# Patient Record
Sex: Male | Born: 1960
Health system: Southern US, Community
[De-identification: ages and names within clinical notes are randomized; demographics above are authoritative.]

## PROBLEM LIST (undated history)

## (undated) DIAGNOSIS — I2510 Atherosclerotic heart disease of native coronary artery without angina pectoris: Secondary | ICD-10-CM

## (undated) DIAGNOSIS — C4490 Unspecified malignant neoplasm of skin, unspecified: Secondary | ICD-10-CM

## (undated) DIAGNOSIS — D369 Benign neoplasm, unspecified site: Secondary | ICD-10-CM

## (undated) DIAGNOSIS — E785 Hyperlipidemia, unspecified: Secondary | ICD-10-CM

## (undated) DIAGNOSIS — I10 Essential (primary) hypertension: Secondary | ICD-10-CM

## (undated) DIAGNOSIS — K219 Gastro-esophageal reflux disease without esophagitis: Secondary | ICD-10-CM

## (undated) DIAGNOSIS — K760 Fatty (change of) liver, not elsewhere classified: Secondary | ICD-10-CM

## (undated) DIAGNOSIS — G4730 Sleep apnea, unspecified: Secondary | ICD-10-CM

## (undated) DIAGNOSIS — K589 Irritable bowel syndrome without diarrhea: Secondary | ICD-10-CM

## (undated) DIAGNOSIS — G4733 Obstructive sleep apnea (adult) (pediatric): Secondary | ICD-10-CM

## (undated) HISTORY — DX: Hyperlipidemia, unspecified: E78.5

## (undated) HISTORY — DX: Benign neoplasm, unspecified site: D36.9

## (undated) HISTORY — DX: Sleep apnea, unspecified: G47.30

## (undated) HISTORY — DX: Obstructive sleep apnea (adult) (pediatric): G47.33

## (undated) HISTORY — DX: Atherosclerotic heart disease of native coronary artery without angina pectoris: I25.10

## (undated) HISTORY — DX: Unspecified malignant neoplasm of skin, unspecified: C44.90

## (undated) HISTORY — DX: Irritable bowel syndrome, unspecified: K58.9

## (undated) HISTORY — DX: Fatty (change of) liver, not elsewhere classified: K76.0

## (undated) HISTORY — DX: Gastro-esophageal reflux disease without esophagitis: K21.9

## (undated) HISTORY — DX: Essential (primary) hypertension: I10

---

## 2005-10-25 ENCOUNTER — Ambulatory Visit (HOSPITAL_COMMUNITY): Admission: RE | Admit: 2005-10-25 | Discharge: 2005-10-25 | Payer: Self-pay | Admitting: Gastroenterology

## 2005-11-18 ENCOUNTER — Encounter: Admission: RE | Admit: 2005-11-18 | Discharge: 2005-11-18 | Payer: Self-pay | Admitting: Gastroenterology

## 2006-01-06 ENCOUNTER — Ambulatory Visit (HOSPITAL_COMMUNITY): Admission: RE | Admit: 2006-01-06 | Discharge: 2006-01-06 | Payer: Self-pay | Admitting: Gastroenterology

## 2006-08-11 ENCOUNTER — Ambulatory Visit: Payer: Self-pay | Admitting: Family Medicine

## 2006-11-08 HISTORY — PX: OTHER SURGICAL HISTORY: SHX169

## 2006-11-11 ENCOUNTER — Ambulatory Visit: Payer: Self-pay | Admitting: Family Medicine

## 2006-11-11 LAB — CONVERTED CEMR LAB
BUN: 13 mg/dL (ref 6–23)
CO2: 29 meq/L (ref 19–32)
Creatinine, Ser: 1.2 mg/dL (ref 0.4–1.5)
Glomerular Filtration Rate, Af Am: 83 mL/min/{1.73_m2}
Potassium: 4.6 meq/L (ref 3.5–5.1)
Sodium: 138 meq/L (ref 135–145)

## 2006-12-13 ENCOUNTER — Ambulatory Visit: Payer: Self-pay | Admitting: Family Medicine

## 2006-12-13 LAB — CONVERTED CEMR LAB
CO2: 29 meq/L (ref 19–32)
Calcium: 9.3 mg/dL (ref 8.4–10.5)
GFR calc Af Amer: 83 mL/min
GFR calc non Af Amer: 69 mL/min
Glucose, Bld: 97 mg/dL (ref 70–99)
Potassium: 4.3 meq/L (ref 3.5–5.1)

## 2007-02-13 ENCOUNTER — Ambulatory Visit: Payer: Self-pay | Admitting: Family Medicine

## 2007-02-13 LAB — CONVERTED CEMR LAB
BUN: 11 mg/dL (ref 6–23)
CO2: 30 meq/L (ref 19–32)
Calcium: 10 mg/dL (ref 8.4–10.5)
Chloride: 100 meq/L (ref 96–112)
Creatinine, Ser: 1.1 mg/dL (ref 0.4–1.5)
GFR calc Af Amer: 92 mL/min
Glucose, Bld: 97 mg/dL (ref 70–99)

## 2007-05-17 ENCOUNTER — Ambulatory Visit: Payer: Self-pay | Admitting: Family Medicine

## 2007-05-17 DIAGNOSIS — I10 Essential (primary) hypertension: Secondary | ICD-10-CM | POA: Insufficient documentation

## 2007-05-17 DIAGNOSIS — K589 Irritable bowel syndrome without diarrhea: Secondary | ICD-10-CM | POA: Insufficient documentation

## 2007-05-17 DIAGNOSIS — K219 Gastro-esophageal reflux disease without esophagitis: Secondary | ICD-10-CM | POA: Insufficient documentation

## 2007-05-17 DIAGNOSIS — K449 Diaphragmatic hernia without obstruction or gangrene: Secondary | ICD-10-CM | POA: Insufficient documentation

## 2007-05-17 DIAGNOSIS — K7689 Other specified diseases of liver: Secondary | ICD-10-CM | POA: Insufficient documentation

## 2007-05-17 LAB — CONVERTED CEMR LAB
ALT: 39 units/L (ref 0–53)
AST: 24 units/L (ref 0–37)
Alkaline Phosphatase: 58 units/L (ref 39–117)
BUN: 13 mg/dL (ref 6–23)
Basophils Relative: 0.8 % (ref 0.0–1.0)
CO2: 31 meq/L (ref 19–32)
Calcium: 9.3 mg/dL (ref 8.4–10.5)
Chloride: 107 meq/L (ref 96–112)
Cholesterol: 229 mg/dL (ref 0–200)
GFR calc Af Amer: 83 mL/min
GFR calc non Af Amer: 69 mL/min
HDL: 32.3 mg/dL — ABNORMAL LOW (ref >39.0)
Lymphocytes Relative: 26.4 % (ref 12.0–46.0)
Monocytes Relative: 10.6 % (ref 3.0–11.0)
Neutro Abs: 3.7 10*3/uL (ref 1.4–7.7)
Platelets: 238 10*3/uL (ref 150–400)
RBC: 4.42 M/uL (ref 4.22–5.81)
Total CHOL/HDL Ratio: 7.1
Total Protein: 6.7 g/dL (ref 6.0–8.3)
Triglycerides: 254 mg/dL (ref 0–149)
VLDL: 51 mg/dL — ABNORMAL HIGH (ref 0–40)
WBC: 6.3 10*3/uL (ref 4.5–10.5)

## 2007-05-18 ENCOUNTER — Telehealth (INDEPENDENT_AMBULATORY_CARE_PROVIDER_SITE_OTHER): Payer: Self-pay | Admitting: *Deleted

## 2007-06-27 ENCOUNTER — Ambulatory Visit: Payer: Self-pay | Admitting: Family Medicine

## 2007-06-27 LAB — CONVERTED CEMR LAB
CO2: 32 meq/L (ref 19–32)
Calcium: 8.8 mg/dL (ref 8.4–10.5)
Chloride: 106 meq/L (ref 96–112)
Glucose, Bld: 109 mg/dL — ABNORMAL HIGH (ref 70–99)

## 2007-06-28 ENCOUNTER — Telehealth (INDEPENDENT_AMBULATORY_CARE_PROVIDER_SITE_OTHER): Payer: Self-pay | Admitting: *Deleted

## 2007-07-03 ENCOUNTER — Ambulatory Visit (HOSPITAL_BASED_OUTPATIENT_CLINIC_OR_DEPARTMENT_OTHER): Admission: RE | Admit: 2007-07-03 | Discharge: 2007-07-03 | Payer: Self-pay | Admitting: Surgery

## 2007-07-03 ENCOUNTER — Encounter (INDEPENDENT_AMBULATORY_CARE_PROVIDER_SITE_OTHER): Payer: Self-pay | Admitting: Family Medicine

## 2007-07-03 ENCOUNTER — Encounter (INDEPENDENT_AMBULATORY_CARE_PROVIDER_SITE_OTHER): Payer: Self-pay | Admitting: Surgery

## 2007-07-11 ENCOUNTER — Encounter (INDEPENDENT_AMBULATORY_CARE_PROVIDER_SITE_OTHER): Payer: Self-pay | Admitting: Family Medicine

## 2007-07-20 DIAGNOSIS — C801 Malignant (primary) neoplasm, unspecified: Secondary | ICD-10-CM | POA: Insufficient documentation

## 2007-08-30 ENCOUNTER — Encounter (INDEPENDENT_AMBULATORY_CARE_PROVIDER_SITE_OTHER): Payer: Self-pay | Admitting: Family Medicine

## 2007-09-27 ENCOUNTER — Ambulatory Visit: Payer: Self-pay | Admitting: Family Medicine

## 2007-09-27 DIAGNOSIS — E782 Mixed hyperlipidemia: Secondary | ICD-10-CM | POA: Insufficient documentation

## 2007-10-02 ENCOUNTER — Telehealth (INDEPENDENT_AMBULATORY_CARE_PROVIDER_SITE_OTHER): Payer: Self-pay | Admitting: *Deleted

## 2007-10-02 LAB — CONVERTED CEMR LAB
BUN: 11 mg/dL (ref 6–23)
Calcium: 9.8 mg/dL (ref 8.4–10.5)
Direct LDL: 148.4 mg/dL
GFR calc Af Amer: 83 mL/min
GFR calc non Af Amer: 69 mL/min
Total CHOL/HDL Ratio: 6.1
Triglycerides: 224 mg/dL (ref 0–149)

## 2007-12-29 ENCOUNTER — Ambulatory Visit: Payer: Self-pay | Admitting: Family Medicine

## 2008-01-02 LAB — CONVERTED CEMR LAB
AST: 45 units/L — ABNORMAL HIGH (ref 0–37)
CO2: 31 meq/L (ref 19–32)
Cholesterol: 205 mg/dL (ref 0–200)
Creatinine, Ser: 1.3 mg/dL (ref 0.4–1.5)
HDL: 29.2 mg/dL — ABNORMAL LOW (ref >39.0)
Potassium: 4.6 meq/L (ref 3.5–5.1)
Sodium: 142 meq/L (ref 135–145)

## 2008-01-03 ENCOUNTER — Telehealth (INDEPENDENT_AMBULATORY_CARE_PROVIDER_SITE_OTHER): Payer: Self-pay | Admitting: *Deleted

## 2008-01-15 ENCOUNTER — Telehealth (INDEPENDENT_AMBULATORY_CARE_PROVIDER_SITE_OTHER): Payer: Self-pay | Admitting: *Deleted

## 2008-01-18 ENCOUNTER — Ambulatory Visit: Payer: Self-pay | Admitting: Cardiology

## 2008-01-18 ENCOUNTER — Encounter (INDEPENDENT_AMBULATORY_CARE_PROVIDER_SITE_OTHER): Payer: Self-pay | Admitting: Family Medicine

## 2008-02-08 ENCOUNTER — Ambulatory Visit: Payer: Self-pay | Admitting: Internal Medicine

## 2008-02-13 LAB — CONVERTED CEMR LAB
AST: 31 units/L (ref 0–37)
Albumin: 3.6 g/dL (ref 3.5–5.2)
HDL: 25.8 mg/dL — ABNORMAL LOW (ref >39.0)
Total CHOL/HDL Ratio: 4.6
Triglycerides: 124 mg/dL (ref 0–149)

## 2008-02-15 ENCOUNTER — Ambulatory Visit: Payer: Self-pay | Admitting: Cardiovascular Disease

## 2008-04-15 ENCOUNTER — Ambulatory Visit: Payer: Self-pay | Admitting: Internal Medicine

## 2008-05-14 ENCOUNTER — Ambulatory Visit: Payer: Self-pay | Admitting: Internal Medicine

## 2008-05-16 ENCOUNTER — Telehealth (INDEPENDENT_AMBULATORY_CARE_PROVIDER_SITE_OTHER): Payer: Self-pay | Admitting: *Deleted

## 2008-05-16 ENCOUNTER — Encounter (INDEPENDENT_AMBULATORY_CARE_PROVIDER_SITE_OTHER): Payer: Self-pay | Admitting: *Deleted

## 2008-05-16 LAB — CONVERTED CEMR LAB
CO2: 29 meq/L (ref 19–32)
Calcium: 9 mg/dL (ref 8.4–10.5)
GFR calc Af Amer: 76 mL/min
Sodium: 136 meq/L (ref 135–145)

## 2008-06-17 ENCOUNTER — Ambulatory Visit: Payer: Self-pay | Admitting: Internal Medicine

## 2008-06-20 ENCOUNTER — Encounter (INDEPENDENT_AMBULATORY_CARE_PROVIDER_SITE_OTHER): Payer: Self-pay | Admitting: *Deleted

## 2008-06-20 ENCOUNTER — Ambulatory Visit: Payer: Self-pay | Admitting: Cardiology

## 2008-06-20 LAB — CONVERTED CEMR LAB
ALT: 54 units/L — ABNORMAL HIGH (ref 0–53)
AST: 29 units/L (ref 0–37)
Albumin: 4.1 g/dL (ref 3.5–5.2)
Alkaline Phosphatase: 69 units/L (ref 39–117)
Cholesterol: 140 mg/dL (ref 0–200)
LDL Cholesterol: 84 mg/dL (ref 0–99)
Total Protein: 6.9 g/dL (ref 6.0–8.3)
Triglycerides: 87 mg/dL (ref 0–149)

## 2008-08-05 ENCOUNTER — Encounter: Payer: Self-pay | Admitting: Internal Medicine

## 2008-08-07 ENCOUNTER — Encounter: Admission: RE | Admit: 2008-08-07 | Discharge: 2008-08-07 | Payer: Self-pay | Admitting: Gastroenterology

## 2008-08-08 ENCOUNTER — Encounter: Payer: Self-pay | Admitting: Internal Medicine

## 2008-08-16 ENCOUNTER — Encounter: Payer: Self-pay | Admitting: Internal Medicine

## 2008-10-21 ENCOUNTER — Ambulatory Visit: Payer: Self-pay | Admitting: Internal Medicine

## 2008-10-21 DIAGNOSIS — R1011 Right upper quadrant pain: Secondary | ICD-10-CM | POA: Insufficient documentation

## 2008-10-22 ENCOUNTER — Ambulatory Visit: Payer: Self-pay | Admitting: Internal Medicine

## 2008-11-15 ENCOUNTER — Telehealth (INDEPENDENT_AMBULATORY_CARE_PROVIDER_SITE_OTHER): Payer: Self-pay | Admitting: *Deleted

## 2008-12-25 ENCOUNTER — Ambulatory Visit: Payer: Self-pay | Admitting: Internal Medicine

## 2008-12-26 ENCOUNTER — Ambulatory Visit: Payer: Self-pay | Admitting: Internal Medicine

## 2009-01-01 ENCOUNTER — Telehealth (INDEPENDENT_AMBULATORY_CARE_PROVIDER_SITE_OTHER): Payer: Self-pay | Admitting: *Deleted

## 2009-01-01 LAB — CONVERTED CEMR LAB
BUN: 15 mg/dL (ref 6–23)
Basophils Absolute: 0 10*3/uL (ref 0.0–0.1)
Basophils Relative: 0.7 % (ref 0.0–3.0)
Calcium: 9.3 mg/dL (ref 8.4–10.5)
Creatinine, Ser: 1.3 mg/dL (ref 0.4–1.5)
Eosinophils Absolute: 0.1 10*3/uL (ref 0.0–0.7)
GFR calc non Af Amer: 62 mL/min
HCT: 41.1 % (ref 39.0–52.0)
Hemoglobin: 14.4 g/dL (ref 13.0–17.0)
Hgb A1c MFr Bld: 5.5 % (ref 4.6–6.0)
MCHC: 35.1 g/dL (ref 30.0–36.0)
MCV: 94.9 fL (ref 78.0–100.0)
Neutro Abs: 3.4 10*3/uL (ref 1.4–7.7)
RBC: 4.33 M/uL (ref 4.22–5.81)
TSH: 1.41 microintl units/mL (ref 0.35–5.50)

## 2009-01-22 ENCOUNTER — Ambulatory Visit: Payer: Self-pay | Admitting: Internal Medicine

## 2009-01-31 ENCOUNTER — Emergency Department (HOSPITAL_COMMUNITY): Admission: EM | Admit: 2009-01-31 | Discharge: 2009-01-31 | Payer: Self-pay | Admitting: Emergency Medicine

## 2009-02-11 ENCOUNTER — Ambulatory Visit: Payer: Self-pay | Admitting: Internal Medicine

## 2009-02-11 LAB — CONVERTED CEMR LAB
Bilirubin Urine: NEGATIVE
Blood in Urine, dipstick: NEGATIVE
Ketones, urine, test strip: NEGATIVE
Nitrite: NEGATIVE
Urobilinogen, UA: 0.2

## 2009-06-18 ENCOUNTER — Ambulatory Visit: Payer: Self-pay | Admitting: Internal Medicine

## 2009-06-19 ENCOUNTER — Encounter (INDEPENDENT_AMBULATORY_CARE_PROVIDER_SITE_OTHER): Payer: Self-pay | Admitting: *Deleted

## 2009-06-19 LAB — CONVERTED CEMR LAB
ALT: 54 units/L — ABNORMAL HIGH (ref 0–53)
BUN: 14 mg/dL (ref 6–23)
Chloride: 100 meq/L (ref 96–112)
GFR calc non Af Amer: 75.43 mL/min (ref >60)
Potassium: 4.1 meq/L (ref 3.5–5.1)
Sodium: 140 meq/L (ref 135–145)

## 2009-07-08 ENCOUNTER — Telehealth (INDEPENDENT_AMBULATORY_CARE_PROVIDER_SITE_OTHER): Payer: Self-pay | Admitting: *Deleted

## 2009-07-29 ENCOUNTER — Ambulatory Visit: Payer: Self-pay | Admitting: Internal Medicine

## 2009-07-29 DIAGNOSIS — D239 Other benign neoplasm of skin, unspecified: Secondary | ICD-10-CM | POA: Insufficient documentation

## 2009-08-08 ENCOUNTER — Encounter: Payer: Self-pay | Admitting: Internal Medicine

## 2009-11-08 DIAGNOSIS — D369 Benign neoplasm, unspecified site: Secondary | ICD-10-CM

## 2009-11-08 HISTORY — DX: Benign neoplasm, unspecified site: D36.9

## 2009-11-08 HISTORY — PX: ESOPHAGOGASTRODUODENOSCOPY: SHX1529

## 2009-12-03 ENCOUNTER — Telehealth (INDEPENDENT_AMBULATORY_CARE_PROVIDER_SITE_OTHER): Payer: Self-pay | Admitting: *Deleted

## 2009-12-16 LAB — HM COLONOSCOPY

## 2009-12-19 ENCOUNTER — Ambulatory Visit: Payer: Self-pay | Admitting: Internal Medicine

## 2009-12-22 LAB — CONVERTED CEMR LAB
ALT: 84 units/L — ABNORMAL HIGH (ref 0–53)
AST: 47 units/L — ABNORMAL HIGH (ref 0–37)
BUN: 11 mg/dL (ref 6–23)
Chloride: 99 meq/L (ref 96–112)
Cholesterol: 161 mg/dL (ref 0–200)
GFR calc non Af Amer: 68.08 mL/min (ref >60)
HDL: 35.7 mg/dL — ABNORMAL LOW (ref >39.00)
Potassium: 4.1 meq/L (ref 3.5–5.1)
Sodium: 138 meq/L (ref 135–145)
Total CHOL/HDL Ratio: 5
Triglycerides: 251 mg/dL — ABNORMAL HIGH (ref 0.0–149.0)
VLDL: 50.2 mg/dL — ABNORMAL HIGH (ref 0.0–40.0)

## 2010-01-08 ENCOUNTER — Encounter: Payer: Self-pay | Admitting: Internal Medicine

## 2010-01-15 ENCOUNTER — Encounter (INDEPENDENT_AMBULATORY_CARE_PROVIDER_SITE_OTHER): Payer: Self-pay | Admitting: *Deleted

## 2010-05-14 ENCOUNTER — Ambulatory Visit: Payer: Self-pay | Admitting: Internal Medicine

## 2010-05-14 LAB — CONVERTED CEMR LAB
Hep A Total Ab: NEGATIVE
Hep B Core Total Ab: POSITIVE — AB
Hep B S Ab: NEGATIVE

## 2010-05-19 LAB — CONVERTED CEMR LAB
ALT: 50 units/L (ref 0–53)
AST: 35 units/L (ref 0–37)
Albumin: 4.1 g/dL (ref 3.5–5.2)
Alkaline Phosphatase: 55 units/L (ref 39–117)
Cholesterol: 172 mg/dL (ref 0–200)
Total Protein: 6.8 g/dL (ref 6.0–8.3)
VLDL: 78.8 mg/dL — ABNORMAL HIGH (ref 0.0–40.0)

## 2010-12-08 NOTE — Assessment & Plan Note (Signed)
Summary: CPX//PH   Vital Signs:  Patient profile:   50 year old male Height:      67.75 inches Weight:      248.6 pounds BMI:     38.22 O2 Sat:      97 % on Room air Pulse rate:   64 / minute Pulse rhythm:   regular BP sitting:   120 / 88  (left arm) Cuff size:   large  Vitals Entered By: Shary Decamp (December 19, 2009 8:07 AM)  O2 Flow:  Room air CC: cpx - fasting Is Patient Diabetic? No Comments  - pain behind testicles x couple weeks  - trouble breathing when lying in bed  - pt had noted some SOB with exertion (states he is trying to lose wt) Shary Decamp  December 19, 2009 8:15 AM    History of Present Illness: CPX   - pain in the area betwen the testicle and anus, mild, no rad, no mass , sx going on   x couple weeks  - trouble breathing when he wakes up,last few seconds,  no orthopnea perse   - pt had noted some SOB with exertion (states he is trying to lose wt)  Preventive Screening-Counseling & Management  Alcohol-Tobacco     Alcohol type: 12 drinks during week, beer/wine  Caffeine-Diet-Exercise     Does Patient Exercise: yes     Times/week: 2  Current Medications (verified): 1)  Lipitor 10 Mg  Tabs (Atorvastatin Calcium) .... Take 1/2 Tab By Mouth At Bedtime 2)  Benicar 20 Mg Tabs (Olmesartan Medoxomil) .Marland Kitchen.. 1 By Mouth Once Daily 3)  Hydrochlorothiazide 25 Mg Tabs (Hydrochlorothiazide) .Marland Kitchen.. 1 By Mouth Once Daily  Allergies (verified): 1)  ! * Bee Stings  Past History:  Past Medical History: HYPERLIPIDEMIA  HYPERTENSION   GERD--HIATAL HERNIA IBS  , chronic upper abd pain, Dx in [place] in early 2000 (had EGD and sigmoidoscopy) chronic upper abd pain , saw Dr Bosie Clos ---> EGD 2009 FATTY LIVER DISEASE dx in [place] early 2000 R groin SCC removed 2008  Past Surgical History: Reviewed history from 12/25/2008 and no changes required. skin cancer , see PMH  Family History: Reviewed history from 12/25/2008 and no changes required. Diabetes  --M Hypertension-- M Stroke-- F   colon ca--no prostate ca--no  Social History: Reviewed history from 05/17/2007 and no changes required. 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 CV:  Denies chest pain or discomfort and palpitations; trace LE edema . Resp:  Denies cough, coughing up blood, and wheezing. GI:  Denies bloody stools, diarrhea, nausea, and vomiting;  . GU:  Denies discharge, dysuria, and hematuria. Psych:  Denies anxiety and depression.  Physical Exam  General:  alert, well-developed, and overweight-appearing.   Neck:  no masses, no thyromegaly, and normal carotid upstroke.   Lungs:  normal respiratory effort, no intercostal retractions, no accessory muscle use, and normal breath sounds.   Heart:  normal rate, regular rhythm, no murmur, and no gallop.   Abdomen:  soft, no distention, no masses, no guarding, and no rigidity.  mild tenderness at  bilateral lower abdomen Rectal:  no external abnormalities, no hemorrhoids, normal sphincter tone, no masses, and no tenderness.   Genitalia:  circumcised, no hydrocele, no varicocele,  no scrotal masses, no testicular masses or atrophy, no cutaneous lesions, and no urethral discharge.   perineum  normal to palpation Prostate:  Prostate gland firm and smooth, no enlargement, nodularity, tenderness, mass, asymmetry or induration. Extremities:  trace  pretibial edema bilaterally  Psych:  Oriented X3, memory intact for recent and remote, normally interactive, good eye contact, not anxious appearing, and not depressed appearing.     Impression & Recommendations:  Problem # 1:  HEALTH SCREENING (ICD-V70.0) Td 2008 had a Cscope - EGD this week @ [facility] continue with mild dyspnea on  exertion, similar symptoms last year, no chest pain. Plan:  recommended a gradual exercise program and reassess in 4 months; personal trainer? his BMI is 58, i'm  also recommend doing gradual weight loss, nutritionist referral ? printed material provided regards diet-exercise  perineal pain: exam neg, to let me know if no better  Orders: Venipuncture (25956) TLB-ALT (SGPT) (84460-ALT) TLB-AST (SGOT) (84450-SGOT) TLB-BMP (Basic Metabolic Panel-BMET) (80048-METABOL) TLB-Lipid Panel (80061-LIPID) TLB-PSA (Prostate Specific Antigen) (84153-PSA) TLB-Hemoglobin (Hgb) (85018-HGB)  Complete Medication List: 1)  Lipitor 10 Mg Tabs (Atorvastatin calcium) .... Take 1/2 tab by mouth at bedtime 2)  Benicar 20 Mg Tabs (Olmesartan medoxomil) .Marland Kitchen.. 1 by mouth once daily 3)  Hydrochlorothiazide 25 Mg Tabs (Hydrochlorothiazide) .Marland Kitchen.. 1 by mouth once daily  Patient Instructions: 1)  Please schedule a follow-up appointment in 4 months .      Preventive Care Screening  Colonoscopy:    Date:  12/15/2009    Results:  normal per pt - did had polyp that was sent for bx   Prior Values:    Last Tetanus Booster:  Tdap (05/17/2007)     Risk Factors:  Tobacco use:  quit    Year quit:  2008 Drug use:  no Alcohol use:  yes    Type:  12 drinks during week, beer/wine Exercise:  yes    Times per week:  2  Colonoscopy History:     Date of Last Colonoscopy:  12/15/2009    Results:  normal per pt - did had polyp that was sent for bx

## 2010-12-08 NOTE — Assessment & Plan Note (Signed)
Summary: 4 mth fu/kdc   Vital Signs:  Patient profile:   50 year old male Weight:      247.50 pounds Pulse rate:   83 / minute Pulse rhythm:   regular BP sitting:   132 / 86  (left arm) Cuff size:   large  Vitals Entered By: Army Fossa CMA (May 14, 2010 8:46 AM) CC: Pt here for follow up on BP and Meds.   History of Present Illness:  routine followup Feels well Past change his lifestyle, eating less fat, less alcohol, no sodas. Still recognizes that he has a problem with portion control mostly at night  ROS No nausea, vomiting, diarrhea Ambulatory BPs in the 150/80 Occasionally get short of breath when he runs upstairs, he is able to exercise for long time as long as it is in a flat surface GERD symptoms well controlled, and needs a prescription  Allergies: 1)  ! * Bee Stings  Past History:  Past Medical History: HYPERLIPIDEMIA  HYPERTENSION   GERD--HIATAL HERNIA IBS  , chronic upper abd pain, Dx in [place] in early 2000 (had EGD and sigmoidoscopy) chronic upper abd pain , saw Dr Bosie Clos ---> EGD 2009 FATTY LIVER DISEASE dx in [place] early 2000 R groin SCC removed 2008-- sees derm routinely per patient   Past Surgical History: Reviewed history from 12/25/2008 and no changes required. skin cancer , see PMH  Social History: Reviewed history from 12/19/2009 and no changes required. 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   Lungs:  normal respiratory effort, no intercostal retractions, no accessory muscle use, and normal breath sounds.   Heart:  normal rate, regular rhythm, no murmur, and no gallop.   Extremities:  trace  pretibial edema bilaterally    Impression & Recommendations:  Problem # 1:   HYPERTENSION (ICD-401.9) at goal  His updated medication list for this problem includes:    Benicar 20 Mg Tabs (Olmesartan medoxomil) .Marland Kitchen... 1 by mouth once daily    Hydrochlorothiazide 25 Mg Tabs (Hydrochlorothiazide) .Marland Kitchen... 1 by mouth once daily  BP today: 132/86 Prior BP: 120/88 (12/19/2009)  Labs Reviewed: K+: 4.1 (12/19/2009) Creat: : 1.2 (12/19/2009)   Chol: 161 (12/19/2009)   HDL: 35.70 (12/19/2009)   LDL: DEL (12/25/2008)   TG: 251.0 (12/19/2009)  Problem # 2:  HYPERLIPIDEMIA (ICD-272.2) triglycerides were moderately elevated praised  for his better lifestyle Labs His updated medication list for this problem includes:    Lipitor 10 Mg Tabs (Atorvastatin calcium) .Marland Kitchen... Take 1/2 tab by mouth at bedtime  Labs Reviewed: SGOT: 47 (12/19/2009)   SGPT: 84 (12/19/2009)   HDL:35.70 (12/19/2009), 30.3 (12/25/2008)  LDL:DEL (12/25/2008), 84 (04/54/0981)  Chol:161 (12/19/2009), 178 (12/25/2008)  Trig:251.0 (12/19/2009), 255 (12/25/2008)  Orders: TLB-Lipid Panel (80061-LIPID)  Problem # 3:  GERD (ICD-530.81) wrote a prescription for over-the-counter omeprazole  His updated medication list for this problem includes:    Prilosec Otc 20 Mg Tbec (Omeprazole magnesium) ..... One by mouth daily before breakfast  Problem # 4:  FATTY LIVER DISEASE (ICD-571.8) chronically increased  LFTs, nodes with fatty liver years ago in [place].  for completeness,will check a hepatitis panel  Orders:  Venipuncture (19147) TLB-Hepatic/Liver Function Pnl (80076-HEPATIC) Misc. Referral (Misc. Ref) T- * Misc.  Laboratory test [phone])  Problem # 5:  SOB going upstairs likely deconditioning ECHO? we agreed to wait and see , he plans to increase his physucal activity even more   Complete Medication List: 1)  Lipitor 10 Mg Tabs (Atorvastatin calcium) .... Take 1/2 tab by mouth at bedtime 2)  Benicar 20 Mg Tabs (Olmesartan medoxomil) .Marland Kitchen.. 1 by mouth once daily 3)  Hydrochlorothiazide 25 Mg Tabs  (Hydrochlorothiazide) .Marland Kitchen.. 1 by mouth once daily 4)  Prilosec Otc 20 Mg Tbec (Omeprazole magnesium) .... One by mouth daily before breakfast  Patient Instructions: 1)  Please schedule a follow-up appointment in 6 months .  Prescriptions: PRILOSEC OTC 20 MG TBEC (OMEPRAZOLE MAGNESIUM) one by mouth daily before breakfast  #90 x 3   Entered and Authorized by:   Nolon Rod. Guinevere Stephenson MD   Signed by:   Nolon Rod. Marissia Blackham MD on 05/14/2010   Method used:   Print then Give to Patient   RxID:   [id number]

## 2010-12-08 NOTE — Letter (Signed)
Summary: Letter with EUS & EGD-- benign findings  Letter with EUS & EGD Results/[facility]   Imported By: Lanelle Bal 01/21/2010 08:44:36  _____________________________________________________________________  External Attachment:    Type:   Image     Comment:   External Document

## 2010-12-08 NOTE — Progress Notes (Signed)
Summary: lipitor refill  Phone Note Refill Request Message from:  Fax from Pharmacy on [facility] FAX [phone]  Refills Requested: Medication #1:  LIPITOR 10 MG  TABS take 1/2 tab by mouth at bedtime Initial call taken by: Barb Merino,  December 03, 2009 9:27 AM    Prescriptions: LIPITOR 10 MG  TABS (ATORVASTATIN CALCIUM) take 1/2 tab by mouth at bedtime  #30 x 0   Entered by:   Doristine Devoid   Authorized by:   Nolon Rod. Paz MD   Signed by:   Doristine Devoid on 12/03/2009   Method used:   [facility]ically to        [phone][facility] (retail)       [address]       [place], [place]  [address]       Ph: [phone]       Fax: [phone]   RxID:   [id number]

## 2010-12-08 NOTE — Miscellaneous (Signed)
Summary: EUS/EGD    Clinical Lists Changes  Observations: Added new observation of EGD: Findings: Normal  Location: [facility]  (01/06/2010 13:15) Added new observation of EUS: LOCATION: [facility] RESULTS: benign  (01/06/2010 13:14)        EUS  Procedure date:  01/06/2010  Findings:      LOCATION: [facility] RESULTS: benign   EGD  Procedure date:  01/06/2010  Findings:      Findings: Normal  Location: [facility]

## 2010-12-10 ENCOUNTER — Ambulatory Visit: Admit: 2010-12-10 | Payer: Self-pay | Admitting: Internal Medicine

## 2010-12-10 ENCOUNTER — Other Ambulatory Visit: Payer: Self-pay | Admitting: Internal Medicine

## 2010-12-10 ENCOUNTER — Encounter: Payer: Self-pay | Admitting: Internal Medicine

## 2010-12-10 ENCOUNTER — Ambulatory Visit (INDEPENDENT_AMBULATORY_CARE_PROVIDER_SITE_OTHER): Payer: 59 | Admitting: Internal Medicine

## 2010-12-10 DIAGNOSIS — I10 Essential (primary) hypertension: Secondary | ICD-10-CM

## 2010-12-10 DIAGNOSIS — R1011 Right upper quadrant pain: Secondary | ICD-10-CM

## 2010-12-10 LAB — BASIC METABOLIC PANEL
Calcium: 9.3 mg/dL (ref 8.4–10.5)
GFR: 71.23 mL/min (ref >60.00)
Glucose, Bld: 94 mg/dL (ref 70–99)
Potassium: 3.7 mEq/L (ref 3.5–5.1)
Sodium: 135 mEq/L (ref 135–145)

## 2010-12-10 LAB — CBC WITH DIFFERENTIAL/PLATELET
Basophils Absolute: 0 10*3/uL (ref 0.0–0.1)
Eosinophils Relative: 1.2 % (ref 0.0–5.0)
HCT: 41.5 % (ref 39.0–52.0)
Hemoglobin: 14.8 g/dL (ref 13.0–17.0)
Lymphocytes Relative: 28.4 % (ref 12.0–46.0)
Monocytes Relative: 11.1 % (ref 3.0–12.0)
Neutro Abs: 3.9 10*3/uL (ref 1.4–7.7)
Platelets: 259 10*3/uL (ref 150.0–400.0)
RDW: 13.1 % (ref 11.5–14.6)
WBC: 6.7 10*3/uL (ref 4.5–10.5)

## 2010-12-16 NOTE — Assessment & Plan Note (Signed)
Summary: 6 month f/u    Vital Signs:  Patient profile:   50 year old male Height:      67.75 inches Weight:      255.13 pounds BMI:     39.22 Pulse rate:   76 / minute Pulse rhythm:   regular BP sitting:   132 / 86  (left arm) Cuff size:   large  Vitals Entered By: Army Fossa CMA (December 10, 2010 10:03 AM) CC: 6 month f/u- fasting  Comments [facility] [address]    History of Present Illness: routine office visit  Had a cold last week, sore throat, postnasal dripping, cough. He rested, took over-the-counter medicines and feels better. Denies fevers. ROS  Good medication compliance with BP and cholesterol medicines Report of very poor diet in the last 2 months, has gained more than 15 pounds. In the last month he has been doing better actually already lost 10 pounds continue with right upper quadrant abdominal pain and abdominal wall cramps. those symptoms are still a main concern for the patient ambulatory blood pressures are around 128/80  Current Medications (verified): 1)  Lipitor 10 Mg  Tabs (Atorvastatin Calcium) .... Take 1/2 Tab By Mouth At Bedtime 2)  Benicar 20 Mg Tabs (Olmesartan Medoxomil) .Marland Kitchen.. 1 By Mouth Once Daily 3)  Hydrochlorothiazide 25 Mg Tabs (Hydrochlorothiazide) .Marland Kitchen.. 1 By Mouth Once Daily 4)  Prilosec Otc 20 Mg Tbec (Omeprazole Magnesium) .... One By Mouth Daily Before Breakfast  Allergies (verified): 1)  ! * Bee Stings  Past History:  Past Medical History: Reviewed history from 05/14/2010 and no changes required. HYPERLIPIDEMIA  HYPERTENSION   GERD--HIATAL HERNIA IBS  , chronic upper abd pain, Dx in [place] in early 2000 (had EGD and sigmoidoscopy) chronic upper abd pain , saw Dr Bosie Clos ---> EGD 2009 FATTY LIVER DISEASE dx in [place] early 2000 R groin SCC removed 2008-- sees derm routinely per patient   Past Surgical History: Reviewed history from 12/25/2008 and no changes required. skin cancer , see PMH  Social  History: Reviewed history from 12/19/2009 and no changes required. 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   Lungs:  normal respiratory effort, no intercostal retractions, no accessory muscle use, and normal breath sounds.   Heart:  normal rate, regular rhythm, no murmur, and no gallop.   Abdomen:  soft, no distention, no masses, no guarding, and no rigidity.     Impression & Recommendations:  Problem # 1:  HYPERLIPIDEMIA (ICD-272.2) encourage healthy lifestyle His updated medication list for this problem includes:    Lipitor 10 Mg Tabs (Atorvastatin calcium) .Marland Kitchen... Take 1/2 tab by mouth at bedtime  Labs Reviewed: SGOT: 35 (05/14/2010)   SGPT: 50 (05/14/2010)   HDL:35.20 (05/14/2010), 35.70 (12/19/2009)  LDL:DEL (12/25/2008), 84 (16/08/9603)  Chol:172 (05/14/2010), 161 (12/19/2009)  Trig:394.0 (05/14/2010), 251.0 (12/19/2009)  Problem # 2:  HYPERTENSION (ICD-401.9) at goal  His updated medication list for this problem includes:    Benicar 20 Mg Tabs (Olmesartan medoxomil) .Marland Kitchen... 1 by mouth once daily    Hydrochlorothiazide 25 Mg Tabs (Hydrochlorothiazide) .Marland Kitchen... 1 by mouth once daily  Orders: Venipuncture (54098) TLB-BMP (Basic Metabolic Panel-BMET) (80048-METABOL) TLB-CBC Platelet - w/Differential (85025-CBCD) Specimen Handling (11914)  BP today: 132/86 Prior BP: 132/86 (05/14/2010)  Labs Reviewed: K+: 4.1 (12/19/2009) Creat: :  1.2 (12/19/2009)   Chol: 172 (05/14/2010)   HDL: 35.20 (05/14/2010)   LDL: DEL (12/25/2008)   TG: 394.0 (05/14/2010)  Problem # 3:  RUQ PAIN (ICD-789.01) chronic right upper quadrant pain and generalized abdominal cramps, symptoms seem to be muscle skeletal in nature Reports a workup in [place] more than 10 years  ago Chart is reviewed In 2006 had unremarkable upper GI series In January 2007 had an unremarkable abdominal CAT scan 2009--was seen by   Dr. Bosie Clos, EGD was essentially negative, the also order ultrasound 2010--was seen in [facility] by Dr. Octaviano Glow at EGD apparently showed a squamus  papilloma, he also recommended an ultrasound 3-11--was referred by  Dr. 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 Plan-----observation for now  Complete Medication List: 1)  Lipitor 10 Mg Tabs (Atorvastatin calcium) .... Take 1/2 tab by mouth at bedtime 2)  Benicar 20 Mg Tabs (Olmesartan medoxomil) .Marland Kitchen.. 1 by mouth once daily 3)  Hydrochlorothiazide 25 Mg Tabs (Hydrochlorothiazide) .Marland Kitchen.. 1 by mouth once daily 4)  Prilosec Otc 20 Mg Tbec (Omeprazole magnesium) .... One by mouth daily before breakfast  Patient Instructions: 1)  Please schedule a follow-up appointment in 4 months . fasting, physical exam   Orders Added: 1)  Venipuncture [36415] 2)  TLB-BMP (Basic Metabolic Panel-BMET) [80048-METABOL] 3)  TLB-CBC Platelet - w/Differential [85025-CBCD] 4)  Specimen Handling [99000] 5)  Est. Patient Level III [16109]

## 2011-02-18 LAB — DIFFERENTIAL
Eosinophils Relative: 1 % (ref 0–5)
Lymphocytes Relative: 22 % (ref 12–46)
Lymphs Abs: 1.5 10*3/uL (ref 0.7–4.0)
Monocytes Absolute: 0.8 10*3/uL (ref 0.1–1.0)
Monocytes Relative: 11 % (ref 3–12)

## 2011-02-18 LAB — CBC
HCT: 41.5 % (ref 39.0–52.0)
Hemoglobin: 14.9 g/dL (ref 13.0–17.0)
MCV: 93.9 fL (ref 78.0–100.0)
RBC: 4.41 MIL/uL (ref 4.22–5.81)
WBC: 6.8 10*3/uL (ref 4.0–10.5)

## 2011-02-18 LAB — BASIC METABOLIC PANEL
Chloride: 97 mEq/L (ref 96–112)
GFR calc non Af Amer: >60 mL/min (ref >60)
Potassium: 3.8 mEq/L (ref 3.5–5.1)
Sodium: 133 mEq/L — ABNORMAL LOW (ref 135–145)

## 2011-02-18 LAB — HEMOCCULT GUIAC POC 1CARD (OFFICE): Fecal Occult Bld: NEGATIVE

## 2011-02-26 ENCOUNTER — Ambulatory Visit (INDEPENDENT_AMBULATORY_CARE_PROVIDER_SITE_OTHER): Payer: 59 | Admitting: Internal Medicine

## 2011-02-26 ENCOUNTER — Encounter: Payer: Self-pay | Admitting: Internal Medicine

## 2011-02-26 DIAGNOSIS — I10 Essential (primary) hypertension: Secondary | ICD-10-CM

## 2011-02-26 DIAGNOSIS — R1011 Right upper quadrant pain: Secondary | ICD-10-CM

## 2011-02-26 NOTE — Patient Instructions (Signed)
Amyotrophic Lateral Sclerosis (ALS) Amyotrophic Lateral Sclerosis (ALS) is a disease of the nervous system. It is also known as Engineer, agricultural disease. It has no known cause.  SYMPTOMS ALS shows up (presents) with the loss of nerve cells.  It tends to strike the movement (motor) nerves. These are the nerves that control your muscles.   The nerves used for feeling (sensation) are usually not affected.   With this disease there is continuing (progressive) loss of muscle control and weakness of most muscles. This is most noticeable in the arms and legs.   Later in the disease there is weakness of the facial muscles. Also, there are problems with swallowing and breathing. The voice may also become affected because of problems with weakness.  The thinking ability, movements of the eyes, feeling sensations and circular muscles (sphincters) that help control bowels and urination all continue to work. DIAGNOSIS The diagnosis of ALS is usually made by observing the physical findings (clinical presentation). X-ray and lab studies may be done to support treatment of possible complications. ALS is a disease with no known cause. At this time, there is no known treatment or cure for the disease. It is progressive. So, the muscle weakness will get worse with time. The disease usually ends with respiratory failure since the body is no longer able to breathe. The muscles which keep you breathing are no longer strong enough to do their job. The muscles which control the rib cage become too weak to provide adequate breathing (respiration).  FOR MORE INFORMATION OR TO FIND SUPPORT GROUPS, CONTACT: ALS Association: www.alsa.[facility]: http://evans-marshall.biz/ Document Released: 10/19/2001 Document Re-Released: 01/19/2010 [facility] Patient Information 2011 [place], [place].

## 2011-02-26 NOTE — Assessment & Plan Note (Signed)
Blood pressure is great. He is exercising more and has started to eat better. Patient is concerned about the ongoing edema. I think part of her problem is his weight. We had a discussion about weight loss. Weight watchers? Optifast?, calorie counting?

## 2011-02-26 NOTE — Assessment & Plan Note (Addendum)
Chronic right upper quadrant pain for more than 10 years, extensive workup before negative. B/c  he has "cramps" in the abdomen and occasional difficulty swallowing he thinks he has ALS. He has no motor deficits or other sx  to consider ALS.Marland Kitchen We had a long discussion about this. Information about symptoms of ALS provided. see patient instructions.

## 2011-02-26 NOTE — Progress Notes (Signed)
  Subjective:    Patient ID: George Hernandez, male    DOB: 1961-06-10, 50 y.o.   MRN: [id number]  HPI Long history of abdominal discomfort. See previous entries. Wonders if his symptoms are consistent with ALS because sometimes he has abdominal cramps, difficulty swallowing and a hoarse voice from time to time  Past Medical History  Diagnosis Date  . Hyperlipemia   . Hypertension   . GERD (gastroesophageal reflux disease)     Hiatal hernia  . IBS (irritable bowel syndrome)     chronic upper abd pain, Dx in [place] in early 2000 (EGD and sigmoidoscopy)  . Chronic abdominal pain   . Fatty liver     disease, dx in [place] early 2000  . Skin cancer    Past Surgical History  Procedure Date  . Right groin scc removed 2008    sees derm routinely per pt     Review of Systems Exercising more lately, diet has improved. He feels great and his blood pressure is very good however he is not losing weight. Continue with lower extremity edema.     Objective:   Physical Exam Alert and oriented x3, no apparent distress except for mild anxiety (which is his baseline) Lungs are clear to auscultation bilaterally. Cardiovascular regular rate and rhythm without murmur. Abdomen, not distended, soft, good bowel sounds. He has a 1 inch reducible umbilical hernia. Extremities +/+++  edema bilaterally       Assessment & Plan:

## 2011-03-14 ENCOUNTER — Other Ambulatory Visit: Payer: Self-pay | Admitting: Internal Medicine

## 2011-03-23 NOTE — Assessment & Plan Note (Signed)
[facility]                               LIPID CLINIC NOTE   George Hernandez, George Hernandez                         MRN:          [id number]  DATE:06/20/2008                            DOB:          1961-02-25    Mr. Couey comes in today for followup of his hyperlipidemia therapy,  which includes Lipitor 10 mg daily.   The only other medication he was on previously was amlodipine, which has  been changed to a blood pressure medicine that he cannot recall the name  of.  He has been compliant with Lipitor 10 mg daily and is tolerating it  just fine with no muscle aches or pains to report.   Physical exam includes he is in no acute distress.  He has been feeling  good lately.  His weight is 232 pounds, which is down 6 pounds in the  last 4 months.   LABORATORY DATA:  Includes total cholesterol of 140, triglycerides 87,  HDL 38.5, and LDL 84.  Liver function tests are within normal limits  except for a very slightly elevated ALT at 54.   ASSESSMENT:  Mr. Minichiello has been continuing to try to follow a heart  healthy diet.  He has been exercising at that gym on a regular basis and  that shows in his weight loss.  His triglycerides are improved and they  remain at goal.  His HDL has also improved, but is not yet at goal of  greater than 40.  His LDL worsened slightly from 67 up to 84, but it is  still within the goal of less than 100.  Mr. Quezada expressed the desire  to reduce or discontinue cholesterol medicine if at all possible.  We  talked about that being a possibility in the future, especially with his  lifestyle modifications he is making.  If his cholesterol numbers stay  well below goals, he could possibly discontinue cholesterol medications.   PLAN:  We are going to continue with Lipitor.  He really wanted to try  to reduce the dose and see if that he can keep his numbers at goal, so  we are going to do that, 5 mg daily.  A prescription was sent to his  pharmacy, which is [facility] on [address].  I encouraged him to  continue with exercise and diet programs.  I congratulated him on his  weight  loss.  We are going to follow up with him in December 2009 with a repeat  lipid and liver panel, and we will go from there.     Charolotte Eke, PharmD  Electronically Signed      Rollene Rotunda, MD, [facility]  Electronically Signed   TP/MedQ  DD: 06/20/2008  DT: 06/20/2008  Job #: [id number]   cc:   Willow Ora, MD

## 2011-03-23 NOTE — Op Note (Signed)
NAMEMUSCAB, BRENNEMAN                ACCOUNT NO.:  [id number]   MEDICAL RECORD NO.:  [id number]          PATIENT TYPE:  AMB   LOCATION:  [facility]                         FACILITY:  [facility]   PHYSICIAN:  Wilmon Arms. Corliss Skains, M.D. DATE OF BIRTH:  [birth year]   DATE OF PROCEDURE:  07/03/2007  DATE OF DISCHARGE:                               OPERATIVE REPORT   PREOPERATIVE DIAGNOSIS:  Right groin mass.   POSTOPERATIVE DIAGNOSIS:  Right groin mass.   PROCEDURE PERFORMED:  Excision of right groin mass.   SURGEON:  Wilmon Arms. Corliss Skains, M.D., FACS   ANESTHESIA:  Local MAC.   INDICATIONS:  The patient is a 50 year old male who has had a mass  growing in his right groin for 4-5 years.  Part of this is external and  looks like a skin tag.  However, adjacent to this area there is a small  granulated opening which has been persistently draining purulent fluid.  He presents for excision.   DESCRIPTION OF PROCEDURE:  The patient is brought to the operating room  and placed in supine position on the operating room table.  His right  leg was flexed and rotated externally exposing the groin.  This area was  shaved and prepped with Betadine.  He was given intravenous sedation.  Local anesthetic was used to anesthetize the entire area.  An elliptical  incision was made around the entire mass and draining site.  Cautery was  used to remove what appeared to be subcutaneous cyst.  We cut back to  healthy appearing fat.  This did not extend down to the fascia.  The  specimen was sent for pathologic examination.  The wound was then  thoroughly irrigated with saline. It was closed with a deep layer of 3-0  Vicryl and a subcuticular layer of 4-0 Monocryl.  Dermabond was used to  seal the skin.  The patient was awakened and brought to the recovery  room in stable condition.  All sponge, instrument, and needle counts  were correct.      Wilmon Arms. Tsuei, M.D.  Electronically Signed     MKT/MEDQ  D:  07/03/2007   T:  07/03/2007  Job:  [id number]   cc:   Leanne Chang, M.D.  Fax: [phone]

## 2011-03-23 NOTE — Assessment & Plan Note (Signed)
[facility]                               LIPID CLINIC NOTE   KRYSTLE, OBERMAN                         MRN:          [id number]  DATE:02/15/2008                            DOB:          04-13-1961    HISTORY OF PRESENT ILLNESS:  Mr. Mian comes in today for follow up of  is hyperlipidemia therapy which includes Lipitor 10 mg daily.  He has  been compliant with this therapy and is tolerating it fine with no new  muscle aches or pains.  Other medications include amlodipine and he  recently started doxycycline prescribed by his dermatologist.   PHYSICAL EXAMINATION:  VITAL SIGNS:  Weight 238 pounds, blood pressure  115/70, heart rate 80.   LABORATORY DATA:  Total cholesterol 118, triglycerides 124, HDL 25.8,  LDL 67, liver function tests within normal limits.   ASSESSMENT:  Mr. Schleifer is tolerating his hyperlipidemia therapy.  He  has been taking Lipitor 10 mg daily.  At the previous visit, we talked  about him increasing that to 20 mg daily after one week.  He did not do  this.  He stuck with the 10 mg dose.  This was a miscommunication, but  the 10 mg dose seems to be working just fine.  His LDL is greatly  improved and is at goal of less than 100.  His HDL has not risen and it  is not at goal greater than 40.  His triglycerides are improved, and are  now at goal of less than 150.  He has greatly increased his physical  activity by going to the gym 4-5 times a week.  He spends 30-45 minutes  on resistance training and 40 minutes on a treadmill.  He has been  working to improve his diet and bring it more in line with a heart  healthy diet.  He reports this has mainly been through portion control  and cutting out sweets and carbohydrates.   PLAN:  We are going to continue with the same medications, Lipitor 10 mg  daily.  Asked him to continue with his diet and exercise modifications  which seem like they are working.  I encouraged him to continue  with  those.  We are going to follow up with him in August 2009.  A written  prescription was given to him for Lipitor 10 mg daily.  He will be  getting his labs drawn before his next appointment at the [facility] office.  An order was sent to them for that lab work.      Charolotte Eke, PharmD  Electronically Signed      Rollene Rotunda, MD, [facility]  Electronically Signed   TP/MedQ  DD: 02/15/2008  DT: 02/15/2008  Job #: [id number]

## 2011-03-23 NOTE — Assessment & Plan Note (Signed)
[facility]                               LIPID CLINIC NOTE   JAECION, George Hernandez                         MRN:          [id number]  DATE:01/18/2008                            DOB:          [birth year]    HISTORY:  [name] comes in today for his first visit with [place] in the  Hyperlipidemia Clinic.  He was referred to [place] by a Dr. Blossom Hoops.   PAST MEDICAL HISTORY:  1. The hyperlipidemia.  2. Hypertension.  3. GERD.  4. Possibility of fatty liver.   CURRENT MEDICATIONS:  Norvasc.   He has no known drug allergies, BUT HE WAS INTOLERANT OF FISH OIL  CAPSULES.   SOCIAL HISTORY:  He quit smoking one year ago, he drinks alcoholic  drinks about 12 drinks spread out over the course of a week.  Typical  diet includes a breakfast of an English muffin with peanut butter or  with cream cheese, lunch is with wheat bread, ham sandwich, no  mayonnaise, dinners, he and his wife try to limit red meat, they eat a  lot of [place] and chicken.  Of note, his mother-in-law lives with them,  who has type 2 diabetes so they try to follow a diabetic diet.  Exercise, he has a pretty sedentary job as an Scientist, clinical (histocompatibility and immunogenetics).  He has recently joined the  gym and plans on going frequently and reports that he used to go to the  gym quite a lot when he was younger.   FAMILY HISTORY:  Heart disease includes the grandfather, who died of an  MI, and his father, who died in his 58s of a CVA.   PHYSICAL EXAMINATION:  VITAL SIGNS:  His weight is 238 pounds, his blood  pressure is 140/90, his heart rate is 80.   LABORATORY DATA:  Total cholesterol of 205, triglycerides 195, HDL 29.2,  LDL 141.8.  LFTs included AST of 52, ALT of 45.   ASSESSMENT:  [name] cholesterol is elevated.  For primary  prevention, we would like his triglycerides to be less than 150, his HDL  to be greater than 40, and his LDL to be less than 100.  Besides a trial  of fish oil capsules, he has  never been on any cholesterol medications.   PLAN:  We are going to start Lipitor 10 mg for a week, then 10 mg daily  for a week, and then increase to 20 mg daily, samples were given to him.  We talked about his diet, a heart-healthy diet, I gave him a handout  with the information on it.  He seems really encouraged to start  exercising.  We are going to follow up with him in four weeks, will  repeat liver and lipid panels.  He was encouraged to call us with any  concerns or problems in the meantime.  We talked about possible side  effects of Lipitor being muscle aches and pains, and possible liver  toxicity that could manifest as abdominal pain.  He has an appointment  to see a gastroenterologist in a  week to evaluate some epigastric pain  that he has been having.      Charolotte Eke, PharmD  Electronically Signed      Rollene Rotunda, MD, [facility]  Electronically Signed   TP/MedQ  DD: 01/18/2008  DT: 01/18/2008  Job #: [id number]   cc:   Burman Foster [facility]  Leanne Chang, M.D.

## 2011-03-26 NOTE — Assessment & Plan Note (Signed)
[facility]                        [place] OFFICE NOTE   George Hernandez, George Hernandez                         MRN:          [id number]  DATE:12/13/2006                            DOB:          30-Sep-1961    REASON FOR VISIT:  Blood pressure check.   George Hernandez is a 50 year old male who was seen about a month ago with  elevated blood pressure.  His lisinopril was increased to 20 mg.  He  reports he has not had any side effects.  He states his blood pressure  at home is averaging the mid 130s over 80s to upper 70s. Additionally,  he quit smoking 2 weeks ago.  He feels overall well.   He also reports that about 2 weeks ago he fell from his porch and  supported himself by outstretching his arms.  He noticed significant  discomfort in his right shoulder to the point that he was unable to lift  it over his head.  In the last week the pain has decreased markedly and  now he has full range of motion of his shoulder.  He denies any  weakness, paresthesias or numbness.   MEDICATIONS:  Lisinopril.   ALLERGIES:  PENICILLIN.   OBJECTIVE:  VITAL SIGNS:  Initial blood pressure 140/92, repeated in 10  minutes 120/88.  Blood pressure reading on his home machine is 133/88.  Weight 226.2, temperature 97.0, pulse 88.  GENERAL:  We have a pleasant male in no acute distress, answers  questions appropriately.  NECK:  Supple, no lymphadenopathy, carotid bruits or JVD.  LUNGS:  Clear.  HEART:  Regular rate and rhythm, normal S1 and S2.  No murmurs, gallops  or rubs.  EXTREMITIES:  No cyanosis, clubbing, edema.  Examination of the right  shoulder significant for no obvious deformity.  He had full range of  motion with no significant discomfort.  Mild discomfort with assisted  abduction.  Negative impingement sign.   IMPRESSION:  1. Hypertension, improved.  2. Smoking cessation.  3. Right shoulder strain.   PLAN:  1. Will continue lisinopril 20 mg daily and will  check a BMET today.  2. Congratulated the patient on his lifestyle changes.  Advised the      patient that this smoking cessation will definitely help with      controlling his blood pressure.  3. Regarding his shoulder, since symptoms are improving I advised that      if it regresses or      does not completely resolve he is to follow up.  4. The patient to follow up with me in 2 months or sooner if need be.      He will continue to monitor his blood pressure closely at home.     George Hernandez, M.D.  Electronically Signed    LA/MedQ  DD: 12/13/2006  DT: 12/13/2006  Job #: [id number]

## 2011-04-13 ENCOUNTER — Encounter: Payer: 59 | Admitting: Internal Medicine

## 2011-05-31 ENCOUNTER — Ambulatory Visit (INDEPENDENT_AMBULATORY_CARE_PROVIDER_SITE_OTHER): Payer: 59 | Admitting: Internal Medicine

## 2011-05-31 ENCOUNTER — Encounter: Payer: Self-pay | Admitting: Internal Medicine

## 2011-05-31 DIAGNOSIS — R5381 Other malaise: Secondary | ICD-10-CM

## 2011-05-31 DIAGNOSIS — Z0000 Encounter for general adult medical examination without abnormal findings: Secondary | ICD-10-CM | POA: Insufficient documentation

## 2011-05-31 DIAGNOSIS — R079 Chest pain, unspecified: Secondary | ICD-10-CM

## 2011-05-31 DIAGNOSIS — I10 Essential (primary) hypertension: Secondary | ICD-10-CM

## 2011-05-31 DIAGNOSIS — K219 Gastro-esophageal reflux disease without esophagitis: Secondary | ICD-10-CM

## 2011-05-31 DIAGNOSIS — G5710 Meralgia paresthetica, unspecified lower limb: Secondary | ICD-10-CM

## 2011-05-31 DIAGNOSIS — R5383 Other fatigue: Secondary | ICD-10-CM

## 2011-05-31 DIAGNOSIS — E782 Mixed hyperlipidemia: Secondary | ICD-10-CM

## 2011-05-31 DIAGNOSIS — C801 Malignant (primary) neoplasm, unspecified: Secondary | ICD-10-CM

## 2011-05-31 LAB — LIPID PANEL
Cholesterol: 161 mg/dL (ref 0–200)
VLDL: 46 mg/dL — ABNORMAL HIGH (ref 0.0–40.0)

## 2011-05-31 LAB — COMPREHENSIVE METABOLIC PANEL
CO2: 31 mEq/L (ref 19–32)
Calcium: 9.1 mg/dL (ref 8.4–10.5)
Chloride: 98 mEq/L (ref 96–112)
Creatinine, Ser: 1.1 mg/dL (ref 0.4–1.5)
GFR: 76.44 mL/min (ref >60.00)
Glucose, Bld: 110 mg/dL — ABNORMAL HIGH (ref 70–99)
Sodium: 136 mEq/L (ref 135–145)
Total Bilirubin: 0.9 mg/dL (ref 0.3–1.2)
Total Protein: 7.3 g/dL (ref 6.0–8.3)

## 2011-05-31 LAB — LDL CHOLESTEROL, DIRECT: Direct LDL: 109 mg/dL

## 2011-05-31 LAB — PSA: PSA: 0.62 ng/mL (ref 0.10–4.00)

## 2011-05-31 NOTE — Assessment & Plan Note (Signed)
Sees dermatology every 6 months.

## 2011-05-31 NOTE — Assessment & Plan Note (Signed)
See  review of systems, reports severe fatigue. The patient has risk factors for sleep apnea. Recommend an apnea link

## 2011-05-31 NOTE — Assessment & Plan Note (Signed)
Improved per patient report.   

## 2011-05-31 NOTE — Assessment & Plan Note (Addendum)
Reports exertional chest tightness, EKG at baseline and normal, recommend a stress test

## 2011-05-31 NOTE — Progress Notes (Signed)
  Subjective:    Patient ID: George Hernandez, male    DOB: 08-Aug-1961, 50 y.o.   MRN: [id number]  HPI CPX  Past Medical History  Diagnosis Date  . Hyperlipemia   . Hypertension   . GERD (gastroesophageal reflux disease)     Hiatal hernia  . IBS (irritable bowel syndrome)     chronic upper abd pain, Dx in [place] in early 2000 (EGD and sigmoidoscopy)  . Fatty liver     disease, dx in [place] early 2000  . Skin cancer     sees derm q 6 months    Past Surgical History  Procedure Date  . Right groin scc removed 2008    sees derm routinely per pt     Family History: Diabetes --M Hypertension-- M CHF--M Stroke-- F   colon ca--no prostate ca--no  Social History: Occupation: IT, Physiological scientist Married (Jakavion Bilodeau), 1 child  Pt's mother lives w/ them Former Smoker:quit Feb 2008 Alcohol use-yes: 6-8 beers weekly Drug use-no Regular exercise-- 4 to 5 times a week diet-- improved     Review of Systems  Constitutional: Negative for fever.  Respiratory: Negative for cough and shortness of breath.   Cardiovascular: Negative for leg swelling.  Gastrointestinal:       Ongoing GI  issues , see previous notes, then acute GI issues (diarrhea) after a trip to [facility], already getting better    occasionally, the anterior aspect of the left tight gets numb GERD symptoms are overall better, despite that, he feels some chest tightness and lightheadedness whenever he exercises. Report he is able to sleep 8-10 hours yet he is very sleepy during the daytime, quite  fatigue, takes a 2-hour nap whenever possible. Wife notes that snoring but not episodes of apnea       Objective:   Physical Exam  Constitutional: He is oriented to person, place, and time. He appears well-developed. No distress.  HENT:  Head: Normocephalic and atraumatic.  Neck:       Normal carotid pulses.  Cardiovascular: Normal rate, regular rhythm and normal heart sounds.   No murmur  heard. Pulmonary/Chest: Effort normal and breath sounds normal. No respiratory distress. He has no wheezes. He has no rales.  Abdominal: Bowel sounds are normal. He exhibits no distension. There is no tenderness. There is no rebound.  Genitourinary: Rectum normal and prostate normal.  Musculoskeletal: He exhibits no edema.  Neurological: He is alert and oriented to person, place, and time.  Skin: He is not diaphoretic.          Assessment & Plan:  Besides the  complete physical exam we spent an additional 15 minutes assessing his fatigue (OSA?), chest pain: Additional testing ordered today

## 2011-05-31 NOTE — Assessment & Plan Note (Signed)
Symptoms as described above are consistent with meralgia paresthetica

## 2011-05-31 NOTE — Assessment & Plan Note (Addendum)
Td 2008 had a Cscope - EGD 2011 at [facility], also a EUS EGD @ [facility], had a bx---benign findings Lifestyle has improved, has lost 7 pounds in the last few months. They said. Labs

## 2011-05-31 NOTE — Assessment & Plan Note (Signed)
Seems to be well controlled.

## 2011-05-31 NOTE — Assessment & Plan Note (Signed)
Labs

## 2011-06-01 ENCOUNTER — Telehealth: Payer: Self-pay | Admitting: Internal Medicine

## 2011-06-01 NOTE — Telephone Encounter (Signed)
Per [facility], a Physician to Physician review is required in reference to your Order for a Rest Stress Test.  The phone number is [phone], choose Option 4, and give Case # [id number].

## 2011-06-03 NOTE — Telephone Encounter (Signed)
Discussed  with the insurance doctor, and he agreed with the test, the notification n number is as follows: CC [phone]

## 2011-06-04 NOTE — Telephone Encounter (Signed)
Patient scheduled for 06-10-11 and he is aware.

## 2011-06-09 ENCOUNTER — Ambulatory Visit (INDEPENDENT_AMBULATORY_CARE_PROVIDER_SITE_OTHER): Payer: 59 | Admitting: Pulmonary Disease

## 2011-06-09 DIAGNOSIS — G4733 Obstructive sleep apnea (adult) (pediatric): Secondary | ICD-10-CM

## 2011-06-10 ENCOUNTER — Ambulatory Visit (HOSPITAL_COMMUNITY): Payer: 59 | Attending: Internal Medicine | Admitting: Radiology

## 2011-06-10 DIAGNOSIS — R0789 Other chest pain: Secondary | ICD-10-CM

## 2011-06-10 DIAGNOSIS — I472 Ventricular tachycardia, unspecified: Secondary | ICD-10-CM

## 2011-06-10 MED ORDER — TECHNETIUM TC 99M TETROFOSMIN IV KIT
33.0000 | PACK | Freq: Once | INTRAVENOUS | Status: AC | PRN
  Administered 2011-06-10: 33 via INTRAVENOUS

## 2011-06-10 MED ORDER — TECHNETIUM TC 99M TETROFOSMIN IV KIT
11.0000 | PACK | Freq: Once | INTRAVENOUS | Status: AC | PRN
  Administered 2011-06-10: 11 via INTRAVENOUS

## 2011-06-10 NOTE — Progress Notes (Signed)
[facility] SITE 3 NUCLEAR MED [address] [place] [address] [phone]  Cardiology Nuclear Med Study  George Hernandez is a 50 y.o. male [id number] [birth year]   Nuclear Med Background Indication for Stress Test:  Evaluation for Ischemia History:  No previous documented CAD Cardiac Risk Factors: History of Smoking, Hypertension, Lipids and Obesity  Symptoms:  Chest Pain/Tightness with and without Exertion (last episode of chest discomfort was last week), Dizziness, DOE/SOB, Fatigue and Near Syncope   Nuclear Pre-Procedure Caffeine/Decaff Intake:  None NPO After: 10:00pm   Lungs:  na IV 0.9% NS with Angio Cath:  20g  IV Site: R Hand  IV Started by:  Stanton Kidney, EMT-P  Chest Size (in):  46 Cup Size: n/a  Height: 5\' 9"  (1.753 m)  Weight:  250 lb (113.399 kg)  BMI:  Body mass index is 36.92 kg/(m^2). Tech Comments:  A.M. Medications taken.      Nuclear Med Study 1 or 2 day study: 1 day  Stress Test Type:  Stress  Reading MD: Olga Millers, MD  Order Authorizing Provider:  Willow Ora, MD  Resting Radionuclide: Technetium 77m Tetrofosmin  Resting Radionuclide Dose: 11.0 mCi   Stress Radionuclide:  Technetium 69m Tetrofosmin  Stress Radionuclide Dose: 33.0 mCi           Stress Protocol Rest HR: 70 Stress HR: 179  Rest BP: 121/76 Stress BP: 171/70  Exercise Time (min): 8:00 METS: 10.4   Predicted Max HR: 169 bpm % Max HR: 105.92 bpm Rate Pressure Product: 96295   Dose of Adenosine (mg):  n/a Dose of Lexiscan: n/a mg  Dose of Atropine (mg): n/a Dose of Dobutamine: n/a mcg/kg/min (at max HR)  Stress Test Technologist: Smiley Houseman, CMA-N  Nuclear Technologist:  Doyne Keel, CNMT     Rest Procedure:  Myocardial perfusion imaging was performed at rest 45 minutes following the intravenous administration of Technetium 7m Tetrofosmin.  Rest ECG: No acute changes.  Stress Procedure:  The patient exercised for eight minutes on the treadmill utilizing  the Bruce protocol.  The patient stopped due to fatigue and ectopy.  He did c/o chest tightness, 4/10, that was relieved immediately post exercise.  There were no diagnostic ST-T wave changes.  There were occasional PVC's with couplets and one short burst of v-tach.  Technetium 49m Tetrofosmin was injected at peak exercise and myocardial perfusion imaging was performed after a brief delay.  Stress ECG: No significant ST segment change suggestive of ischemia.  QPS Raw Data Images:  Acquisition technically good; normal left ventricular size. Stress Images:  There is decreased uptake in the apex. Rest Images:  There is decreased uptake in the apex less prominent compared to the stress images. Subtraction (SDS):  There is apical thinning and possible very mild apical ischemia. Transient Ischemic Dilatation (Normal <1.22):  1.05 Lung/Heart Ratio (Normal <0.45):  0.33  Quantitative Gated Spect Images QGS EDV:  81 ml QGS ESV:  30 ml QGS cine images:  NL LV Function; NL Wall Motion QGS EF: 63%  Impression Exercise Capacity:  Fair exercise capacity. BP Response:  Normal blood pressure response. Clinical Symptoms:  There is chest pain. ECG Impression:  No significant ST segment change suggestive of ischemia. Comparison with Prior Nuclear Study: No previous nuclear study performed  Overall Impression:  There is apical thinning and possible very mild apical ischemia; note 4 beats NSVT during exercise and chest pain.   Olga Millers

## 2011-06-11 NOTE — Progress Notes (Signed)
Nuclear report routed to Dr. Drue Novel. Treyshaun Keatts, Farris Has

## 2011-06-12 ENCOUNTER — Encounter: Payer: Self-pay | Admitting: Pulmonary Disease

## 2011-06-12 DIAGNOSIS — G4733 Obstructive sleep apnea (adult) (pediatric): Secondary | ICD-10-CM | POA: Insufficient documentation

## 2011-06-12 NOTE — Assessment & Plan Note (Signed)
The pt has very mild osa by his recent sleep study.  Treatment options can include weight loss, surgery, dental appliance, and cpap.  This degree of sleep apnea represents very little CV risk, and therefore the decision to treat aggressively should depend upon its impact to his QOL.  Clinical correlation suggested.

## 2011-06-12 NOTE — Progress Notes (Signed)
The pt underwent home sleep testing with a type 3 portable device.  Airflow, oxygen saturation, effort and pulse rate were all monitored during this time.  The raw data and tracings have been reviewed with the following findings:  1) flow evaluation period of 6hrs and 2) the pt was found to have 12 obstructive apneas and 27 hypopneas, for an AHI 6/hr 3) oxygen desaturation as low as 89% noted. 4) no significant tachy or bradycardia.

## 2011-06-15 ENCOUNTER — Telehealth: Payer: Self-pay | Admitting: Internal Medicine

## 2011-06-15 DIAGNOSIS — R9439 Abnormal result of other cardiovascular function study: Secondary | ICD-10-CM

## 2011-06-15 NOTE — Telephone Encounter (Signed)
See below, notify pt

## 2011-06-15 NOTE — Telephone Encounter (Signed)
Results as follows: Overall Impression: There is apical thinning and possible very mild apical ischemia; note 4 beats NSVT during exercise and chest pain.  I informally  discussed the results with the cardiologist, because the chest pain and question of ischemia he recommended a cardiology evaluation. Plan: Please arrange for a cardiology referral and let the patient know

## 2011-06-15 NOTE — Telephone Encounter (Signed)
Patient scheduled for OV Fri @ 4:00pm

## 2011-06-15 NOTE — Telephone Encounter (Signed)
Pt informed of abnormality on Stress test results that will be discussed in further detail OV Fri 06/18/11 (discuss sleep apnea options) Pt informed that referral to Cardiology has been placed.

## 2011-06-15 NOTE — Telephone Encounter (Signed)
Advise patient, recent test showed mild sleep apnea, please arrange an office visit to discuss options.

## 2011-06-15 NOTE — Telephone Encounter (Signed)
Noted  

## 2011-06-18 ENCOUNTER — Ambulatory Visit (INDEPENDENT_AMBULATORY_CARE_PROVIDER_SITE_OTHER): Payer: 59 | Admitting: Internal Medicine

## 2011-06-18 ENCOUNTER — Encounter: Payer: Self-pay | Admitting: Internal Medicine

## 2011-06-18 DIAGNOSIS — G4733 Obstructive sleep apnea (adult) (pediatric): Secondary | ICD-10-CM

## 2011-06-18 DIAGNOSIS — I10 Essential (primary) hypertension: Secondary | ICD-10-CM

## 2011-06-18 DIAGNOSIS — R9439 Abnormal result of other cardiovascular function study: Secondary | ICD-10-CM

## 2011-06-18 NOTE — Assessment & Plan Note (Signed)
Cost of benicar is an issues, will call if ready to switch to losartan.

## 2011-06-18 NOTE — Assessment & Plan Note (Signed)
Mildly abnormal stress test, to see cardiology. Patient wonders if he can continue doing his exercises, I think is fine as long as he does not have chest pain

## 2011-06-18 NOTE — Assessment & Plan Note (Signed)
Mild sleep apnea per sleep study 06-2011. Discussed with the patient the increased cardiovascular risk that the sleep apnea impose. Treatment options include a CPAP, weight loss, dental appliance. For now I encouraged him to continue with his healthier lifestyle and see his dentist.

## 2011-06-18 NOTE — Progress Notes (Signed)
  Subjective:    Patient ID: George Hernandez, male    DOB: [birth year], 50 y.o.   MRN: [id number]  HPI Here to discuss results of recent tests   Past Medical History  Diagnosis Date  . Hyperlipemia   . Hypertension   . GERD (gastroesophageal reflux disease)     Hiatal hernia  . IBS (irritable bowel syndrome)     chronic upper abd pain, Dx in [place] in early 2000 (EGD and sigmoidoscopy)  . Fatty liver     disease, dx in [place] early 2000  . Skin cancer     sees derm q 6 months    Past Surgical History  Procedure Date  . Right groin scc removed 2008    sees derm routinely per pt      Review of Systems  2 weeks ago started to eat better and exercise more, denies any chest pain with exertion    Objective:   Physical Exam  alert oriented x3, no apparent distress        Assessment & Plan:  Today , I spent more than 15 min with the patient, >50% of the time counseling

## 2011-06-19 ENCOUNTER — Other Ambulatory Visit: Payer: Self-pay | Admitting: Internal Medicine

## 2011-06-21 NOTE — Telephone Encounter (Signed)
Rx Done . 

## 2011-06-22 ENCOUNTER — Encounter: Payer: Self-pay | Admitting: Internal Medicine

## 2011-07-05 ENCOUNTER — Encounter: Payer: Self-pay | Admitting: Cardiovascular Disease

## 2011-07-06 ENCOUNTER — Ambulatory Visit (INDEPENDENT_AMBULATORY_CARE_PROVIDER_SITE_OTHER): Payer: 59 | Admitting: Cardiovascular Disease

## 2011-07-06 ENCOUNTER — Encounter: Payer: Self-pay | Admitting: Cardiology

## 2011-07-06 ENCOUNTER — Encounter: Payer: Self-pay | Admitting: Cardiovascular Disease

## 2011-07-06 VITALS — BP 118/82 | HR 80 | Ht 70.0 in | Wt 248.0 lb

## 2011-07-06 DIAGNOSIS — R079 Chest pain, unspecified: Secondary | ICD-10-CM

## 2011-07-06 DIAGNOSIS — R9439 Abnormal result of other cardiovascular function study: Secondary | ICD-10-CM

## 2011-07-06 LAB — CBC WITH DIFFERENTIAL/PLATELET
Basophils Absolute: 0 10*3/uL (ref 0.0–0.1)
Eosinophils Absolute: 0 10*3/uL (ref 0.0–0.7)
HCT: 40.4 % (ref 39.0–52.0)
Lymphs Abs: 1.8 10*3/uL (ref 0.7–4.0)
MCHC: 34.5 g/dL (ref 30.0–36.0)
MCV: 94.7 fl (ref 78.0–100.0)
Monocytes Absolute: 0.6 10*3/uL (ref 0.1–1.0)
Monocytes Relative: 10.3 % (ref 3.0–12.0)
Neutro Abs: 3.5 10*3/uL (ref 1.4–7.7)
Platelets: 215 10*3/uL (ref 150.0–400.0)
RDW: 13 % (ref 11.5–14.6)

## 2011-07-06 LAB — BASIC METABOLIC PANEL
BUN: 14 mg/dL (ref 6–23)
CO2: 30 mEq/L (ref 19–32)
Calcium: 9 mg/dL (ref 8.4–10.5)
Creatinine, Ser: 1 mg/dL (ref 0.4–1.5)

## 2011-07-06 LAB — PROTIME-INR: INR: 1 ratio (ref 0.8–1.0)

## 2011-07-06 NOTE — Patient Instructions (Signed)
Your physician recommends that you schedule a follow-up appointment in: 3-4 weeks with [facility]  Your physician has requested that you have a cardiac catheterization. Cardiac catheterization is used to diagnose and/or treat various heart conditions. Doctors may recommend this procedure for a number of different reasons. The most common reason is to evaluate chest pain. Chest pain can be a symptom of coronary artery disease (CAD), and cardiac catheterization can show whether plaque is narrowing or blocking your heart's arteries. This procedure is also used to evaluate the valves, as well as measure the blood flow and oxygen levels in different parts of your heart. For further information please visit [email or web address] Please follow instruction sheet, as given.

## 2011-07-06 NOTE — Assessment & Plan Note (Signed)
See above

## 2011-07-06 NOTE — Assessment & Plan Note (Signed)
Exertional chest pain and abnormal stress myoview suggestive of ischemia. Will arrange cath in the outpt cath lab at [facility] on Thursday of this week. Labs today.

## 2011-07-06 NOTE — Progress Notes (Signed)
History of Present Illness:50 yo male with h/o HTN, HLD, GERD and OSA here today to establish cardiology care. He has been seen recently by Dr. Drue Novel in primary care. He has had recent chest tightness and SOB when walking up hills and steps. This has been occurring lately with walking. First noticed these type of symptoms one year ago.  He had a recent stress myoview per Dr. Drue Novel which showed possible apical ischemia with apical thinning, EF of 63%.   Past Medical History  Diagnosis Date  . Hyperlipemia   . Hypertension   . GERD (gastroesophageal reflux disease)     Hiatal hernia  . IBS (irritable bowel syndrome)     chronic upper abd pain, Dx in [place] in early 2000 (EGD and sigmoidoscopy)  . Fatty liver     disease, dx in [place] early 2000  . Skin cancer     sees derm q 6 months   . Obstructive sleep apnea     Past Surgical History  Procedure Date  . Right groin scc removed 2008    sees derm routinely per pt     Current Outpatient Prescriptions  Medication Sig Dispense Refill  . atorvastatin (LIPITOR) 10 MG tablet TAKE 1 TABLET BY MOUTH AT BEDTIME  30 tablet  2  . hydrochlorothiazide 25 MG tablet TAKE 1 TABLET BY MOUTH EVERY DAY  90 tablet  1  . multivitamin (ONE-A-DAY MEN'S) TABS Take 1 tablet by mouth daily.        Marland Kitchen olmesartan (BENICAR) 20 MG tablet Take 20 mg by mouth daily.        Marland Kitchen omeprazole (PRILOSEC OTC) 20 MG tablet Take 20 mg by mouth daily.          No Known Allergies  History   Social History  . Marital Status: Married    Spouse Name: N/A    Number of Children: 1   . Years of Education: N/A   Occupational History  . IT, Physiological scientist    Social History Main Topics  . Smoking status: Former Games developer  . Smokeless tobacco: Not on file  . Alcohol Use: 3.6 - 4.8 oz/week    6-8 Cans of beer per week  . Drug Use: No  . Sexually Active: Not on file   Other Topics Concern  . Not on file   Social History Narrative   Regular Exercise- 2 times a  weekDiet- not good x last 2 months     Family History  Problem Relation Age of Onset  . Diabetes Mother   . Hypertension Mother   . Stroke Father   . Colon cancer Neg Hx   . Prostate cancer Neg Hx     Review of Systems:  As stated in the HPI and otherwise negative.   BP 118/82  Pulse 80  Ht 5\' 10"  (1.778 m)  Wt 248 lb (112.492 kg)  BMI 35.58 kg/m2  Physical Examination: General: Well developed, well nourished, NAD HEENT: OP clear, mucus membranes moist SKIN: warm, dry. No rashes. Neuro: No focal deficits Musculoskeletal: Muscle strength 5/5 all ext Psychiatric: Mood and affect normal Neck: No JVD, no carotid bruits, no thyromegaly, no lymphadenopathy. Lungs:Clear bilaterally, no wheezes, rhonci, crackles Cardiovascular: Regular rate and rhythm. No murmurs, gallops or rubs. Abdomen:Soft. Bowel sounds present. Non-tender.  Extremities: No lower extremity edema. Pulses are 2 + in the bilateral DP/PT.  EKG:NSR, rate 78 bpm.   Stress Myoview August 2,2012: Stress Procedure: The patient exercised for eight minutes on  the treadmill utilizing the Bruce protocol. The patient stopped due to fatigue and ectopy. He did c/o chest tightness, 4/10, that was relieved immediately post exercise. There were no diagnostic ST-T wave changes. There were occasional PVC's with couplets and one short burst of v-tach. Technetium 39m Tetrofosmin was injected at peak exercise and myocardial perfusion imaging was performed after a brief delay.  Stress ECG: No significant ST segment change suggestive of ischemia.  QPS  Raw Data Images: Acquisition technically good; normal left ventricular size.  Stress Images: There is decreased uptake in the apex.  Rest Images: There is decreased uptake in the apex less prominent compared to the stress images.  Subtraction (SDS): There is apical thinning and possible very mild apical ischemia.  Transient Ischemic Dilatation (Normal <1.22): 1.05  Lung/Heart Ratio  (Normal <0.45): 0.33  Quantitative Gated Spect Images  QGS EDV: 81 ml  QGS ESV: 30 ml  QGS cine images: NL LV Function; NL Wall Motion  QGS EF: 63%  Impression  Exercise Capacity: Fair exercise capacity.  BP Response: Normal blood pressure response.  Clinical Symptoms: There is chest pain.  ECG Impression: No significant ST segment change suggestive of ischemia.  Comparison with Prior Nuclear Study: No previous nuclear study performed  Overall Impression: There is apical thinning and possible very mild apical ischemia; note 4 beats NSVT during exercise and chest pain.

## 2011-07-08 ENCOUNTER — Inpatient Hospital Stay (HOSPITAL_BASED_OUTPATIENT_CLINIC_OR_DEPARTMENT_OTHER)
Admission: RE | Admit: 2011-07-08 | Discharge: 2011-07-08 | Disposition: A | Discharge status: Home / Self Care | Payer: 59 | Source: Ambulatory Visit | Attending: Cardiovascular Disease | Admitting: Cardiovascular Disease

## 2011-07-08 DIAGNOSIS — I2510 Atherosclerotic heart disease of native coronary artery without angina pectoris: Secondary | ICD-10-CM

## 2011-07-08 DIAGNOSIS — E785 Hyperlipidemia, unspecified: Secondary | ICD-10-CM | POA: Insufficient documentation

## 2011-07-08 DIAGNOSIS — R0602 Shortness of breath: Secondary | ICD-10-CM | POA: Insufficient documentation

## 2011-07-08 DIAGNOSIS — I10 Essential (primary) hypertension: Secondary | ICD-10-CM | POA: Insufficient documentation

## 2011-07-08 NOTE — Cardiovascular Report (Signed)
  NAMEKALIF, KATTNER NO.:  [id number]  MEDICAL RECORD NO.:  [id number]  LOCATION:                                 FACILITY:  PHYSICIAN:  Verne Carrow, MDDATE OF BIRTH:  November 01, 1961  DATE OF PROCEDURE:  07/08/2011 DATE OF DISCHARGE:                           CARDIAC CATHETERIZATION   PRIMARY CARE PHYSICIAN:  Willow Ora, MD  PROCEDURES PERFORMED: 1. Left heart catheterization. 2. Selective coronary angiography. 3. Left ventricular angiogram.  OPERATOR:  Verne Carrow, MD  INDICATIONS:  This is a 50 year old Caucasian male with a history of hypertension and hyperlipidemia who has had recent episodes of chest discomfort and shortness of breath on walking up steep hills.  The patient underwent stress Myoview per Dr. Drue Novel that showed possible apical ischemia with ejection fraction of 63%.  I saw the patient in the office 2 days ago and based on his abnormal stress tests and clinical complaints I decided to perform left heart catheterization.  DETAILS OF PROCEDURE:  The patient was brought to the Outpatient Cardiac Catheterization Laboratory after signing informed consent for the procedure.  The right groin was prepped and draped in a sterile fashion. 1% lidocaine was used for local anesthesia.  A 4-French sheath was inserted into the right femoral artery without difficulty.  Standard diagnostic catheters were used to perform selective coronary angiography.  A pigtail catheter was used to perform a left ventricular angiogram.  The patient tolerated the procedure well.  There were no immediate complications.  The patient was taken to the recovery area in stable condition.  HEMODYNAMIC FINDINGS:  Central aortic pressure 100/68.  Left ventricular pressure 102/7/12.  ANGIOGRAPHIC FINDINGS: 1. The left main coronary artery had no evidence of disease. 2. The left anterior descending artery had mild 10% proximal plaque.     The midvessel had a 30%  stenosis just after the takeoff of a large     bifurcating diagonal branch.  This did not appear to be flow     limiting.  The distal vessel had no evidence of disease.  The     moderate-sized diagonal branch had mild plaque disease. 3. The circumflex artery gave off a moderate-sized obtuse marginal     branch that is free of disease.  The mid circumflex artery had mild     5%-10% plaque.  The distal posterolateral branch had no disease. 4. The right coronary artery is a moderate-sized codominant vessel     with no evidence of disease. 5. Left ventricular angiogram was performed in the RAO projection and     showed normal left ventricular systolic function with ejection     fraction of 60%.  IMPRESSION: 1. Mild nonobstructive coronary artery disease. 2. Normal left ventricular systolic function.  RECOMMENDATIONS:  At this time, I recommend continued medical management.  The patient should be continued on aspirin and statin.     Verne Carrow, MD     CM/MEDQ  D:  07/08/2011  T:  07/08/2011  Job:  [id number]  cc:   Willow Ora, MD  Electronically Signed by Verne Carrow MD on 07/08/2011 05:02:44 PM

## 2011-07-09 ENCOUNTER — Encounter: Payer: Self-pay | Admitting: *Deleted

## 2011-07-19 ENCOUNTER — Other Ambulatory Visit: Payer: Self-pay | Admitting: Internal Medicine

## 2011-07-22 ENCOUNTER — Encounter: Payer: Self-pay | Admitting: Cardiovascular Disease

## 2011-07-22 ENCOUNTER — Ambulatory Visit (INDEPENDENT_AMBULATORY_CARE_PROVIDER_SITE_OTHER): Payer: 59 | Admitting: Cardiovascular Disease

## 2011-07-22 VITALS — BP 113/72 | HR 80 | Ht 69.0 in | Wt 247.0 lb

## 2011-07-22 DIAGNOSIS — I2510 Atherosclerotic heart disease of native coronary artery without angina pectoris: Secondary | ICD-10-CM

## 2011-07-22 NOTE — Patient Instructions (Signed)
Your physician wants you to follow-up in: 1 year  You will receive a reminder letter in the mail two months in advance. If you don't receive a letter, please call our office to schedule the follow-up appointment.  Your physician recommends that you continue on your current medications as directed. Please refer to the Current Medication list given to you today.  

## 2011-07-22 NOTE — Progress Notes (Signed)
History of Present Illness:50 yo male with h/o HTN, HLD, GERD and OSA here today for cardiac follow up. He was seen as a new pt two weeks ago. He had recent chest tightness and SOB when walking up hills and steps. This has been occurring lately with walking. First noticed these type of symptoms one year ago. He had a recent stress myoview per Dr. Drue Novel which showed possible apical ischemia with apical thinning, EF of 63%. I arranged a left heart cath at [facility] on 07/08/11 which showed mild non-obstructive CAD. LV function was normal.   He tells me today that he is feeling better. He has been exercising in the gym and is doing well. No chest pain. Breathing seems to be better.    Past Medical History  Diagnosis Date  . Hyperlipemia   . Hypertension   . GERD (gastroesophageal reflux disease)     Hiatal hernia  . IBS (irritable bowel syndrome)     chronic upper abd pain, Dx in [place] in early 2000 (EGD and sigmoidoscopy)  . Fatty liver     disease, dx in [place] early 2000  . Skin cancer     sees derm q 6 months   . Obstructive sleep apnea     Past Surgical History  Procedure Date  . Right groin scc removed 2008    sees derm routinely per pt     Current Outpatient Prescriptions  Medication Sig Dispense Refill  . atorvastatin (LIPITOR) 10 MG tablet        . BENICAR 20 MG tablet TAKE 1 TABLET BY MOUTH ONCE DAILY  30 tablet  5  . hydrochlorothiazide 25 MG tablet TAKE 1 TABLET BY MOUTH EVERY DAY  90 tablet  1  . multivitamin (ONE-A-DAY MEN'S) TABS Take 1 tablet by mouth daily.        Marland Kitchen omeprazole (PRILOSEC OTC) 20 MG tablet Take 20 mg by mouth daily.          No Known Allergies  History   Social History  . Marital Status: Married    Spouse Name: N/A    Number of Children: 1   . Years of Education: N/A   Occupational History  . IT, Physiological scientist    Social History Main Topics  . Smoking status: Former Games developer  . Smokeless tobacco: Not on file  . Alcohol Use: 3.6 - 4.8  oz/week    6-8 Cans of beer per week  . Drug Use: No  . Sexually Active: Not on file   Other Topics Concern  . Not on file   Social History Narrative   Regular Exercise- 2 times a weekDiet- not good x last 2 months     Family History  Problem Relation Age of Onset  . Diabetes Mother   . Hypertension Mother   . Stroke Father   . Colon cancer Neg Hx   . Prostate cancer Neg Hx     Review of Systems:  As stated in the HPI and otherwise negative.   BP 113/72  Pulse 80  Ht 5\' 9"  (1.753 m)  Wt 247 lb (112.038 kg)  BMI 36.48 kg/m2  Physical Examination: General: Well developed, well nourished, NAD HEENT: OP clear, mucus membranes moist SKIN: warm, dry. No rashes. Neuro: No focal deficits Musculoskeletal: Muscle strength 5/5 all ext Psychiatric: Mood and affect normal Neck: No JVD, no carotid bruits, no thyromegaly, no lymphadenopathy. Lungs:Clear bilaterally, no wheezes, rhonci, crackles Cardiovascular: Regular rate and rhythm. No murmurs, gallops or  rubs. Abdomen:Soft. Bowel sounds present. Non-tender.  Extremities: No lower extremity edema. Pulses are 2 + in the bilateral DP/PT.  Cardiac Cath 07/08/11:  1. The left main coronary artery had no evidence of disease.   2. The left anterior descending artery had mild 10% proximal plaque.       The midvessel had a 30% stenosis just after the takeoff of a large       bifurcating diagonal branch.  This did not appear to be flow       limiting.  The distal vessel had no evidence of disease.  The       moderate-sized diagonal branch had mild plaque disease.   3. The circumflex artery gave off a moderate-sized obtuse marginal       branch that is free of disease.  The mid circumflex artery had mild       5%-10% plaque.  The distal posterolateral branch had no disease.   4. The right coronary artery is a moderate-sized codominant vessel       with no evidence of disease.   5. Left ventricular angiogram was performed in the RAO  projection and       showed normal left ventricular systolic function with ejection       fraction of 60%.

## 2011-07-22 NOTE — Assessment & Plan Note (Signed)
Mild disease by cath. Continue ASA and statin. Will see in one year. Education on diet and exercise.

## 2011-08-20 LAB — POCT I-STAT 4, (NA,K, GLUC, HGB,HCT): Operator id: 268271

## 2011-10-02 ENCOUNTER — Other Ambulatory Visit: Payer: Self-pay | Admitting: Internal Medicine

## 2011-11-10 ENCOUNTER — Ambulatory Visit (INDEPENDENT_AMBULATORY_CARE_PROVIDER_SITE_OTHER): Payer: 59 | Admitting: Internal Medicine

## 2011-11-10 ENCOUNTER — Encounter: Payer: Self-pay | Admitting: Internal Medicine

## 2011-11-10 VITALS — BP 130/82 | HR 92 | Ht 68.0 in | Wt 240.0 lb

## 2011-11-10 DIAGNOSIS — F419 Anxiety disorder, unspecified: Secondary | ICD-10-CM | POA: Insufficient documentation

## 2011-11-10 DIAGNOSIS — F411 Generalized anxiety disorder: Secondary | ICD-10-CM

## 2011-11-10 MED ORDER — ALPRAZOLAM 0.25 MG PO TABS: 0.5000 mg | ORAL_TABLET | Freq: Three times a day (TID) | ORAL | Status: DC | PRN

## 2011-11-10 MED ORDER — ESCITALOPRAM OXALATE 10 MG PO TABS: 10.0000 mg | ORAL_TABLET | Freq: Every day | ORAL | Status: DC

## 2011-11-10 NOTE — Progress Notes (Signed)
  Subjective:    Patient ID: George Hernandez, male    DOB: 11-25-1960, 51 y.o.   MRN: [id number]  HPI Chief complaint today is anxiety: Sx started acutely after Christmas, had similar symptoms before, in fact he was diagnosed with seasonal affective disorder and that is the reason why he moved to [place] a few years ago. This time the symptoms are severe, described as "a feeling of something bad is going to happen", he has a lot of negatives and recurrent  thoughts "what if something happened to me or my family, what if I die". No suicidal plans per se.  Past Medical History  Diagnosis Date  . Hyperlipemia   . Hypertension   . GERD (gastroesophageal reflux disease)     Hiatal hernia  . IBS (irritable bowel syndrome)     chronic upper abd pain, Dx in [place] in early 2000 (EGD and sigmoidoscopy)  . Fatty liver     disease, dx in [place] early 2000  . Skin cancer     sees derm q 6 months   . Obstructive sleep apnea   . CAD (coronary artery disease)     Mild non-obstructive CAD   Past Surgical History  Procedure Date  . Right groin scc removed 2008    sees derm routinely per pt      Review of Systems Denies episodes of mania Mild depression with a decrease in enjoying his cough is Has lost 10 pounds in the last few days mostly because he cut down on caffeine, and he's not drinking anymore because he likes to be more in control. Medications are otherwise unchanged, he's not taken any over-the-counter medicines. Nothing is wrong in his life, there is no major reason for him to be anxious. Also, reports dark stools for a few months, GI symptoms are at baseline or even better than usual.. Specifically no blood in the stools, no hematemesis.    Objective:   Physical Exam  Constitutional: He is oriented to person, place, and time. He appears well-developed and well-nourished.  HENT:  Head: Normocephalic and atraumatic.  Eyes:       Not pale or jaundice  Neurological: He is  alert and oriented to person, place, and time.  Skin: Skin is warm and dry.  Psychiatric:       Slightly anxious, not depressed, otherwise behavior is completely normal        Assessment & Plan:  Dark stools otherwise GI review of systems is essentially negative. I provided him with a iFOB, Will reassess in 3  weeks

## 2011-11-10 NOTE — Assessment & Plan Note (Signed)
acute onset of moderate anxiety without obvious triggers in the setting of a patient with a history of seasonal affective disorder. No suicidal, no symptoms to suggest bipolar disorder. I discussed the patient the diagnosis, treatment, medications, side effects and what to expect from medicines. We agreed to: Start Lexapro Use alprazolam temporarily He needs to see a counselor, information about local counselors  provided RTC 3 weeks F2F > 25 min , > 50% counseling

## 2011-11-10 NOTE — Patient Instructions (Addendum)
Alprazolam is 1 or 2 tablets every 8 hours as needed, will cause drowsiness Keep the appointment to see me in 3 weeks Call if problems Bring the stool test

## 2011-12-01 ENCOUNTER — Ambulatory Visit (INDEPENDENT_AMBULATORY_CARE_PROVIDER_SITE_OTHER): Payer: 59 | Admitting: Internal Medicine

## 2011-12-01 ENCOUNTER — Encounter: Payer: Self-pay | Admitting: Internal Medicine

## 2011-12-01 VITALS — BP 126/82 | HR 78 | Temp 98.5°F | Resp 16 | Wt 235.0 lb

## 2011-12-01 DIAGNOSIS — F411 Generalized anxiety disorder: Secondary | ICD-10-CM

## 2011-12-01 DIAGNOSIS — F419 Anxiety disorder, unspecified: Secondary | ICD-10-CM

## 2011-12-01 MED ORDER — ESCITALOPRAM OXALATE 10 MG PO TABS: 10.0000 mg | ORAL_TABLET | Freq: Every day | ORAL | Status: DC

## 2011-12-01 MED ORDER — ALPRAZOLAM 0.25 MG PO TABS: 0.2500 mg | ORAL_TABLET | Freq: Three times a day (TID) | ORAL | Status: DC | PRN

## 2011-12-01 NOTE — Progress Notes (Signed)
  Subjective:    Patient ID: George Hernandez, male    DOB: 1961-07-03, 51 y.o.   MRN: [id number]  HPI Followup from previous visit, he started Lexapro and  Xanax.   Past Medical History: Hyperlipidemia Hypertension GERD, hiatal hernia IBS  , chronic upper abd pain, Dx in [place] in early 2000 (had EGD and sigmoidoscopy) chronic upper abd pain , saw Dr Bosie Clos ---> EGD 2009 Fatty liver disease dx in [place] early 2000 R groin SCC removed 2008-- sees derm routinely per patient   Past Surgical History: skin cancer , see PMH  Social History: Occupation: IT, Physiological scientist Married, 1 child  Former Smoker:quit Feb 2008 Alcohol use-yes: 6-8 beers weekly Drug use-no Regular exercise-- 2 times a week diet--improved   Review of Systems Doing great, anxiety has decreased, negative thoughts have also decreased tremendously. No suicidal ideas, nausea, vomiting or diarrhea. Uses  Xanax half tablet infrequently. It does cause some drowsiness. Still feels occasionally anxious sometimes on the weekend or some times in the morning Diet has improved, he continued to lose weight.    Objective:   Physical Exam  Alert oriented x3 Note on distress, he seems to be doing very well      Assessment & Plan:

## 2011-12-01 NOTE — Assessment & Plan Note (Addendum)
Improving significantly, he does have some residual anxiety. Recommend to continue with same SSRI dose, refill Xanax at a lower dose (half tablet 3 times a day) f/u in 4 months, if he feels the dose of Lexapro needs  to be adjusted, he is instructed to call sooner.

## 2011-12-02 ENCOUNTER — Other Ambulatory Visit: Payer: 59

## 2011-12-02 DIAGNOSIS — Z5189 Encounter for other specified aftercare: Secondary | ICD-10-CM

## 2011-12-02 LAB — FECAL OCCULT BLOOD, IMMUNOCHEMICAL: Fecal Occult Bld: NEGATIVE

## 2011-12-07 ENCOUNTER — Encounter: Payer: Self-pay | Admitting: Internal Medicine

## 2012-01-04 ENCOUNTER — Other Ambulatory Visit: Payer: Self-pay | Admitting: Internal Medicine

## 2012-01-05 NOTE — Telephone Encounter (Signed)
Refill done.  

## 2012-02-08 ENCOUNTER — Other Ambulatory Visit: Payer: Self-pay | Admitting: Internal Medicine

## 2012-02-08 NOTE — Telephone Encounter (Signed)
Refill done.  

## 2012-03-30 ENCOUNTER — Ambulatory Visit (INDEPENDENT_AMBULATORY_CARE_PROVIDER_SITE_OTHER): Payer: 59 | Admitting: Internal Medicine

## 2012-03-30 ENCOUNTER — Encounter: Payer: Self-pay | Admitting: Internal Medicine

## 2012-03-30 VITALS — BP 126/84 | HR 75 | Temp 98.3°F | Wt 246.0 lb

## 2012-03-30 DIAGNOSIS — R5383 Other fatigue: Secondary | ICD-10-CM

## 2012-03-30 DIAGNOSIS — F411 Generalized anxiety disorder: Secondary | ICD-10-CM

## 2012-03-30 DIAGNOSIS — F419 Anxiety disorder, unspecified: Secondary | ICD-10-CM

## 2012-03-30 DIAGNOSIS — R5381 Other malaise: Secondary | ICD-10-CM

## 2012-03-30 DIAGNOSIS — G5710 Meralgia paresthetica, unspecified lower limb: Secondary | ICD-10-CM

## 2012-03-30 NOTE — Assessment & Plan Note (Signed)
Patient with long history of meralgia paresthetica now complaining of a burning feeling at the tip of the fingers, making it difficult to type. In addition he has numbness in his left toes. Etiology of the finger and toes numbness unclear. Neurology referral.

## 2012-03-30 NOTE — Assessment & Plan Note (Addendum)
Symptoms are currently well controlled. He wonders if is a good idea to stop the medication, on looking back, I remember him being very anxious even before 11-2011 when  Lexapro was started. He has some stress going on at work, I don't think is smart to stop the medication at this point. Reassess every few months.

## 2012-03-30 NOTE — Assessment & Plan Note (Addendum)
Continue with some fatigue, he has mild sleep apnea, encouraged to explore the possibility of a dental appliance and keep working on his diet.

## 2012-03-30 NOTE — Progress Notes (Signed)
  Subjective:    Patient ID: George Hernandez, male    DOB: [birth year], 51 y.o.   MRN: [id number]  HPI Routine office visit Anxiety well controlled with Lexapro, wonders if he could stop it. On further questioning, there is a lot of things going on in his life particularly at work. Working long hours and having a lot of distress. No marital problems, some financial issues.  Today he also complains of a steady discomfort-numbness-burning at the L fourth and fifth fingers. It makes it very difficult to type. Denies neck or elbow pain. No rash. He has also a history of meralgia paresthetica and is concerned about it.  Good compliance with cholesterol and BP meds. BP today very good.  Past Medical History: Hyperlipidemia Hypertension GERD, hiatal hernia IBS  , chronic upper abd pain, Dx in [place] in early 2000 (had EGD and sigmoidoscopy) chronic upper abd pain , saw Dr Bosie Clos ---> EGD 2009 Fatty liver disease dx in [place] early 2000 R groin SCC removed 2008-- sees derm routinely per patient   Past Surgical History: skin cancer , see PMH  Social History: Occupation: IT, Physiological scientist Married, 1 child   Former Smoker:quit Feb 2008 Alcohol use-yes: 6-8 beers weekly Drug use-no diet--not as good lately, gained wt in the last few months   Review of Systems See HPI    Objective:   Physical Exam General -- alert, well-developed, and overweight appearing. No apparent distress.  Extremities-- no pretibial edema bilaterally ; inspection and palpation of the wrists, hands and elbow normal. Neurologic-- alert & oriented X3 , strength and DTRs symmetric. Pinprick examination of the upper extremities normal. Psych-- Cognition and judgment appear intact. Alert and cooperative with normal attention span and concentration.  not anxious appearing and not depressed appearing.       Assessment & Plan:

## 2012-04-18 ENCOUNTER — Other Ambulatory Visit: Payer: Self-pay | Admitting: Internal Medicine

## 2012-04-18 NOTE — Telephone Encounter (Signed)
Refill done.  

## 2012-06-18 ENCOUNTER — Other Ambulatory Visit: Payer: Self-pay | Admitting: Internal Medicine

## 2012-06-30 ENCOUNTER — Encounter: Payer: 59 | Admitting: Internal Medicine

## 2012-07-24 ENCOUNTER — Other Ambulatory Visit: Payer: Self-pay | Admitting: Internal Medicine

## 2012-07-24 MED ORDER — OLMESARTAN MEDOXOMIL 20 MG PO TABS: 20.0000 mg | ORAL_TABLET | Freq: Every day | ORAL | Status: DC

## 2012-07-24 NOTE — Telephone Encounter (Signed)
Refill done.  

## 2012-07-24 NOTE — Telephone Encounter (Signed)
refill benicar 20mg  tablets #30 last fill 9.9.13 no instructions Last ov 5.23.13 70-month follow up

## 2012-08-01 ENCOUNTER — Other Ambulatory Visit: Payer: Self-pay | Admitting: Internal Medicine

## 2012-08-01 NOTE — Telephone Encounter (Signed)
Refill done.  

## 2012-08-24 ENCOUNTER — Other Ambulatory Visit: Payer: Self-pay | Admitting: Internal Medicine

## 2012-08-24 NOTE — Telephone Encounter (Signed)
OV 03/30/12, labs 12/02/11, Benicar 20 mg 07/24/12 #30 x5 Plz advise if pt needs labs, to soon for refill? Plz advise      MW

## 2012-08-25 NOTE — Telephone Encounter (Signed)
Refill done.  

## 2012-09-22 ENCOUNTER — Encounter: Payer: Self-pay | Admitting: Internal Medicine

## 2012-09-22 ENCOUNTER — Ambulatory Visit (INDEPENDENT_AMBULATORY_CARE_PROVIDER_SITE_OTHER): Payer: 59 | Admitting: Internal Medicine

## 2012-09-22 VITALS — BP 134/84 | HR 82 | Temp 98.3°F | Wt 257.0 lb

## 2012-09-22 DIAGNOSIS — F411 Generalized anxiety disorder: Secondary | ICD-10-CM

## 2012-09-22 DIAGNOSIS — E782 Mixed hyperlipidemia: Secondary | ICD-10-CM

## 2012-09-22 DIAGNOSIS — G4733 Obstructive sleep apnea (adult) (pediatric): Secondary | ICD-10-CM

## 2012-09-22 DIAGNOSIS — I2510 Atherosclerotic heart disease of native coronary artery without angina pectoris: Secondary | ICD-10-CM

## 2012-09-22 DIAGNOSIS — Z0000 Encounter for general adult medical examination without abnormal findings: Secondary | ICD-10-CM

## 2012-09-22 DIAGNOSIS — I10 Essential (primary) hypertension: Secondary | ICD-10-CM

## 2012-09-22 DIAGNOSIS — R7309 Other abnormal glucose: Secondary | ICD-10-CM

## 2012-09-22 DIAGNOSIS — F419 Anxiety disorder, unspecified: Secondary | ICD-10-CM

## 2012-09-22 DIAGNOSIS — G5710 Meralgia paresthetica, unspecified lower limb: Secondary | ICD-10-CM

## 2012-09-22 DIAGNOSIS — R739 Hyperglycemia, unspecified: Secondary | ICD-10-CM

## 2012-09-22 LAB — COMPREHENSIVE METABOLIC PANEL
ALT: 54 U/L — ABNORMAL HIGH (ref 0–53)
AST: 35 U/L (ref 0–37)
Alkaline Phosphatase: 58 U/L (ref 39–117)
Creatinine, Ser: 1.1 mg/dL (ref 0.4–1.5)
GFR: 72.93 mL/min (ref >60.00)
Sodium: 138 mEq/L (ref 135–145)
Total Bilirubin: 0.7 mg/dL (ref 0.3–1.2)

## 2012-09-22 LAB — LIPID PANEL
HDL: 34.8 mg/dL — ABNORMAL LOW (ref >39.00)
Total CHOL/HDL Ratio: 6
VLDL: 72.2 mg/dL — ABNORMAL HIGH (ref 0.0–40.0)

## 2012-09-22 LAB — CBC WITH DIFFERENTIAL/PLATELET
Basophils Relative: 0.6 % (ref 0.0–3.0)
Eosinophils Absolute: 0.1 10*3/uL (ref 0.0–0.7)
Lymphocytes Relative: 26.8 % (ref 12.0–46.0)
MCHC: 34.3 g/dL (ref 30.0–36.0)
Monocytes Relative: 9.3 % (ref 3.0–12.0)
Neutrophils Relative %: 62.2 % (ref 43.0–77.0)
RBC: 4.33 Mil/uL (ref 4.22–5.81)
WBC: 6.5 10*3/uL (ref 4.5–10.5)

## 2012-09-22 NOTE — Assessment & Plan Note (Signed)
Due for labs

## 2012-09-22 NOTE — Assessment & Plan Note (Addendum)
Asymptomatic, due to see cardiology, patient aware. Not on ASA--restart!

## 2012-09-22 NOTE — Assessment & Plan Note (Signed)
Meralgia paresthetica and also paresthesias, saw neurology, symptoms are improving and now they are on and off only

## 2012-09-22 NOTE — Assessment & Plan Note (Addendum)
Stress level has decrease, not taking Lexapro daily, feeling well, has not use xanax in a while. Lexapro makes him slightly sleepy and has gained weight. Plan: Discontinue Lexapro, Xanax as needed, will call for a RF prn

## 2012-09-22 NOTE — Assessment & Plan Note (Signed)
Well controlled, labs  

## 2012-09-22 NOTE — Assessment & Plan Note (Signed)
I again encouraged weight loss. Increase cardiovascular risk discuss

## 2012-09-22 NOTE — Progress Notes (Signed)
  Subjective:    Patient ID: George Hernandez, male    DOB: May 18, 1961, 51 y.o.   MRN: [id number]  HPI ROV His stress level has decreased significantly, feeling better, likes to stop Lexapro. See assessment and plan Heart disease, good medication compliance, asymptomatic. Hypertension, ambulatory BPs average 125/82. No side effects from the medications. Paresthesias, saw neurology, symptoms are getting better. High cholesterol, good compliance with Lipitor, no apparent side effects  Past Medical History  Diagnosis Date  . Hyperlipemia   . Hypertension   . GERD (gastroesophageal reflux disease)     Hiatal hernia  . IBS (irritable bowel syndrome)     chronic upper abd pain, Dx in [place] in early 2000 (EGD and sigmoidoscopy)  . Fatty liver     disease, dx in [place] early 2000  . Skin cancer     sees derm q 6 months   . Obstructive sleep apnea   . CAD (coronary artery disease)     Mild non-obstructive CAD   Past Surgical History  Procedure Date  . Right groin scc removed 2008    sees derm routinely per pt     Review of Systems No chest pain or shortness of breath. Diet is okay, mostly healthy but has gained 10 pounds. Exercise, doing little at the present time but plans to increase exercise. Occasional diarrhea, thinks associated with IBS. No nausea vomiting    Objective:   Physical Exam  General -- alert, well-developed, and overweight appearing. No apparent distress.  Lungs -- normal respiratory effort, no intercostal retractions, no accessory muscle use, and normal breath sounds.   Heart-- normal rate, regular rhythm, no murmur, and no gallop.  Extremities-- trace pretibial edema bilaterally  Neurologic-- alert & oriented X3 and strength normal in all extremities. Psych-- Cognition and judgment appear intact. Alert and cooperative with normal attention span and concentration.  not anxious appearing and not depressed appearing.      Assessment & Plan:

## 2012-09-22 NOTE — Assessment & Plan Note (Signed)
Declined a flu shot, explained the benefits

## 2012-09-28 NOTE — Addendum Note (Signed)
Addended by: Edwena Felty T on: 09/28/2012 02:41 PM   Modules accepted: Orders

## 2012-11-06 ENCOUNTER — Telehealth: Payer: Self-pay | Admitting: *Deleted

## 2012-11-06 MED ORDER — ALPRAZOLAM 0.25 MG PO TABS: 0.2500 mg | ORAL_TABLET | Freq: Three times a day (TID) | ORAL | Status: DC | PRN

## 2012-11-06 NOTE — Telephone Encounter (Signed)
Done

## 2012-11-09 NOTE — Telephone Encounter (Signed)
Pt left VM that he lost his Rx for anxiety med and would like to get another rx if possible. Called Pt back left VM that Rx was faxed to pharmacy and to return call to office if this is the incorrect pharmacy.

## 2012-11-13 ENCOUNTER — Other Ambulatory Visit: Payer: 59

## 2012-12-13 ENCOUNTER — Other Ambulatory Visit: Payer: Self-pay | Admitting: *Deleted

## 2012-12-13 MED ORDER — HYDROCHLOROTHIAZIDE 25 MG PO TABS: 25.0000 mg | ORAL_TABLET | Freq: Every day | ORAL | Status: DC

## 2012-12-13 NOTE — Telephone Encounter (Signed)
Refill done.  

## 2013-01-04 ENCOUNTER — Other Ambulatory Visit: Payer: Self-pay | Admitting: Internal Medicine

## 2013-01-04 NOTE — Telephone Encounter (Signed)
Spoke to Coyle at pharmacy. Pt still has refills left on file. Refill request sent in error.

## 2013-01-10 ENCOUNTER — Telehealth: Payer: Self-pay | Admitting: Internal Medicine

## 2013-01-10 ENCOUNTER — Encounter: Payer: Self-pay | Admitting: *Deleted

## 2013-01-10 NOTE — Telephone Encounter (Signed)
Pt made aware rx & controlled substance contract is at front desk ready to be picked up.  

## 2013-01-10 NOTE — Telephone Encounter (Signed)
Ok to refill? Last OV 11.15.13 Last filled 12.30.13

## 2013-01-10 NOTE — Telephone Encounter (Signed)
done

## 2013-02-13 ENCOUNTER — Encounter: Payer: Self-pay | Admitting: Internal Medicine

## 2013-03-21 ENCOUNTER — Other Ambulatory Visit: Payer: Self-pay | Admitting: General Practice

## 2013-03-21 MED ORDER — HYDROCHLOROTHIAZIDE 25 MG PO TABS: 25.0000 mg | ORAL_TABLET | Freq: Every day | ORAL | Status: DC

## 2013-03-21 NOTE — Telephone Encounter (Signed)
Med filled. Will need OV>

## 2013-03-22 ENCOUNTER — Ambulatory Visit (INDEPENDENT_AMBULATORY_CARE_PROVIDER_SITE_OTHER): Payer: 59 | Admitting: Internal Medicine

## 2013-03-22 ENCOUNTER — Encounter: Payer: Self-pay | Admitting: Internal Medicine

## 2013-03-22 VITALS — BP 124/82 | HR 79 | Temp 98.0°F | Ht 68.5 in | Wt 253.0 lb

## 2013-03-22 DIAGNOSIS — R7309 Other abnormal glucose: Secondary | ICD-10-CM

## 2013-03-22 DIAGNOSIS — E782 Mixed hyperlipidemia: Secondary | ICD-10-CM

## 2013-03-22 DIAGNOSIS — Z0000 Encounter for general adult medical examination without abnormal findings: Secondary | ICD-10-CM

## 2013-03-22 DIAGNOSIS — R739 Hyperglycemia, unspecified: Secondary | ICD-10-CM

## 2013-03-22 LAB — COMPREHENSIVE METABOLIC PANEL
ALT: 64 U/L — ABNORMAL HIGH (ref 0–53)
AST: 37 U/L (ref 0–37)
Alkaline Phosphatase: 59 U/L (ref 39–117)
Calcium: 9 mg/dL (ref 8.4–10.5)
Chloride: 97 mEq/L (ref 96–112)
Creatinine, Ser: 1.1 mg/dL (ref 0.4–1.5)

## 2013-03-22 LAB — LIPID PANEL
Cholesterol: 167 mg/dL (ref 0–200)
HDL: 31.8 mg/dL — ABNORMAL LOW (ref >39.00)
Total CHOL/HDL Ratio: 5
VLDL: 58.8 mg/dL — ABNORMAL HIGH (ref 0.0–40.0)

## 2013-03-22 LAB — LDL CHOLESTEROL, DIRECT: Direct LDL: 87.7 mg/dL

## 2013-03-22 NOTE — Progress Notes (Signed)
  Subjective:    Patient ID: George Hernandez, male    DOB: [birth year], 52 y.o.   MRN: [id number]  HPI CPX  Past Medical History  Diagnosis Date  . Hyperlipemia   . Hypertension   . GERD (gastroesophageal reflux disease)     Hiatal hernia  . IBS (irritable bowel syndrome)     chronic upper abd pain, Dx in [place] in early 2000 (EGD and sigmoidoscopy)  . Fatty liver     disease, dx in [place] early 2000  . Skin cancer     sees derm q 6 months   . Obstructive sleep apnea   . CAD (coronary artery disease)     Mild non-obstructive CAD per catrh   Past Surgical History  Procedure Laterality Date  . Right groin scc removed  2008    sees derm routinely per pt    History   Social History  . Marital Status: Married    Spouse Name: N/A    Number of Children: 1   . Years of Education: N/A   Occupational History  . IT, Physiological scientist    Social History Main Topics  . Smoking status: Former Games developer  . Smokeless tobacco: Never Used     Comment: quit 2008  . Alcohol Use: 3.6 - 4.8 oz/week    6-8 Cans of beer per week  . Drug Use: No  . Sexually Active: Not on file   Other Topics Concern  . Not on file   Social History Narrative   Exercise 1-2 times a week, busy at work, has gain wt   Diet -- low salt, trying to eat healthy     Family History  Problem Relation Age of Onset  . Diabetes Mother   . Hypertension Mother   . Stroke Father 27  . Colon cancer Neg Hx   . Prostate cancer Neg Hx     Review of Systems In general doing well. No GERD symptoms. No chest pain or shortness or breath No nausea, vomiting, diarrhea or blood in the stools. no GERD sx  Anxiety ok with Xanax as needed. No dysuria or gross hematuria.     Objective:   Physical Exam BP 124/82  Pulse 79  Temp(Src) 98 F (36.7 C) (Oral)  Ht 5' 8.5" (1.74 m)  Wt 253 lb (114.76 kg)  BMI 37.9 kg/m2  SpO2 97%  General -- alert, well-developed, NAD Neck --no thyromegaly  Lungs -- normal  respiratory effort, no intercostal retractions, no accessory muscle use, and normal breath sounds.   Heart-- normal rate, regular rhythm, no murmur, and no gallop.   Abdomen--soft, non-tender, no distention, no masses, no HSM, no guarding, and no rigidity.   Extremities-- no pretibial edema bilaterally Rectal-- No external abnormalities noted. Normal sphincter tone. No rectal masses or tenderness. Brown stool  Prostate:  Prostate gland firm and smooth, no enlargement, nodularity, tenderness, mass, asymmetry or induration. Neurologic-- alert & oriented X3 and strength normal in all extremities. Psych-- Cognition and judgment appear intact. Alert and cooperative with normal attention span and concentration.  not anxious appearing and not depressed appearing.      Assessment & Plan:

## 2013-03-22 NOTE — Assessment & Plan Note (Addendum)
Currently on Lipitor 5 mg daily, based on the last cholesterol panel I recommended fenofibrate but patient declined to take. We are checking another panel today, he is willing to increase Lipitor if needed but would like to hold off fenofibrate even if the triglycerides are high because he likes to work on his diet and exercise in the next 6 months.

## 2013-03-22 NOTE — Assessment & Plan Note (Addendum)
Td 2008  zostavax discussed had a Cscope - EGD 2011 at [facility], also a EUS EGD @ [facility], had a bx---benign findings  Lifestyle needs improvement, has been unable to exercise lately. Although his diet is described as  healthy, he is gaining weight compared to early 2013. Advise exercise 3 hours a week, start calorie counting, 1800 calories  Labs including a a1c

## 2013-03-22 NOTE — Patient Instructions (Addendum)
Exercise 3 hours a week Calorie counting: 1800 cal/day Zostavax?

## 2013-03-28 ENCOUNTER — Encounter: Payer: Self-pay | Admitting: *Deleted

## 2013-04-15 ENCOUNTER — Other Ambulatory Visit: Payer: Self-pay | Admitting: Internal Medicine

## 2013-05-19 ENCOUNTER — Other Ambulatory Visit: Payer: Self-pay | Admitting: Internal Medicine

## 2013-05-21 NOTE — Telephone Encounter (Signed)
Refill done.  

## 2013-07-30 ENCOUNTER — Other Ambulatory Visit: Payer: Self-pay | Admitting: Internal Medicine

## 2013-07-31 NOTE — Telephone Encounter (Signed)
rx refilled per protocol. DJR  

## 2013-09-20 ENCOUNTER — Ambulatory Visit: Payer: 59 | Admitting: Internal Medicine

## 2013-10-16 ENCOUNTER — Encounter: Payer: Self-pay | Admitting: Internal Medicine

## 2013-10-16 ENCOUNTER — Ambulatory Visit (INDEPENDENT_AMBULATORY_CARE_PROVIDER_SITE_OTHER): Payer: 59 | Admitting: Internal Medicine

## 2013-10-16 VITALS — BP 137/81 | HR 87 | Temp 98.0°F | Wt 248.0 lb

## 2013-10-16 DIAGNOSIS — E782 Mixed hyperlipidemia: Secondary | ICD-10-CM

## 2013-10-16 DIAGNOSIS — F411 Generalized anxiety disorder: Secondary | ICD-10-CM

## 2013-10-16 DIAGNOSIS — F419 Anxiety disorder, unspecified: Secondary | ICD-10-CM

## 2013-10-16 DIAGNOSIS — R7309 Other abnormal glucose: Secondary | ICD-10-CM

## 2013-10-16 DIAGNOSIS — I10 Essential (primary) hypertension: Secondary | ICD-10-CM

## 2013-10-16 DIAGNOSIS — R7303 Prediabetes: Secondary | ICD-10-CM

## 2013-10-16 DIAGNOSIS — K7689 Other specified diseases of liver: Secondary | ICD-10-CM

## 2013-10-16 DIAGNOSIS — R739 Hyperglycemia, unspecified: Secondary | ICD-10-CM | POA: Insufficient documentation

## 2013-10-16 DIAGNOSIS — K760 Fatty (change of) liver, not elsewhere classified: Secondary | ICD-10-CM

## 2013-10-16 LAB — BASIC METABOLIC PANEL
Chloride: 96 mEq/L (ref 96–112)
GFR: 71.16 mL/min (ref >60.00)
Glucose, Bld: 121 mg/dL — ABNORMAL HIGH (ref 70–99)
Potassium: 3.4 mEq/L — ABNORMAL LOW (ref 3.5–5.1)
Sodium: 133 mEq/L — ABNORMAL LOW (ref 135–145)

## 2013-10-16 MED ORDER — ALPRAZOLAM 0.25 MG PO TABS: ORAL_TABLET | ORAL | Status: DC

## 2013-10-16 MED ORDER — ATORVASTATIN CALCIUM 10 MG PO TABS: ORAL_TABLET | ORAL | Status: DC

## 2013-10-16 MED ORDER — HYDROCHLOROTHIAZIDE 25 MG PO TABS: ORAL_TABLET | ORAL | Status: DC

## 2013-10-16 MED ORDER — OLMESARTAN MEDOXOMIL 20 MG PO TABS: ORAL_TABLET | ORAL | Status: DC

## 2013-10-16 NOTE — Assessment & Plan Note (Signed)
Last a1c 5.8, recheck , diet-exercise discussed

## 2013-10-16 NOTE — Assessment & Plan Note (Signed)
Well controled , check a BMP

## 2013-10-16 NOTE — Patient Instructions (Signed)
Get your blood work before you leave  Next visit for a physical exam  fasting, in 6 months  Please make an appointment    If you need more information about a healthy diet, the American Heart Association is a great resource online at:  Mormon101.pl

## 2013-10-16 NOTE — Progress Notes (Signed)
Pre visit review using our clinic review tool, if applicable. No additional management support is needed unless otherwise documented below in the visit note. 

## 2013-10-16 NOTE — Assessment & Plan Note (Addendum)
Elevated LFTs for many years, since he was living  in [place]. Ultrasound here in 2009 show fatty liver Previous hepatitis A and C serology were negative, he had a positive Hep B core ab Plan: Repeat hepatitis B serology

## 2013-10-16 NOTE — Assessment & Plan Note (Signed)
Increased, see HPI, RF xanax. Last UDS 3-14 low, recheck UDS

## 2013-10-16 NOTE — Progress Notes (Signed)
   Subjective:    Patient ID: Cleophas Yoak, male    DOB: [birth year], 52 y.o.   MRN: [id number]  HPI Routine office visit Hypertension--good medication compliance, ambulatory BPs within normal Prediabetes, high triglycerides--diet and exercise have not improved, slightly worse?Marland Kitchen Has gained some weight (per his scale). Insomnia, anxiety--increasing work load and stress at work, using Xanax most at night to help him sleep  Past Medical History  Diagnosis Date  . Hyperlipemia   . Hypertension   . GERD (gastroesophageal reflux disease)     Hiatal hernia  . IBS (irritable bowel syndrome)     chronic upper abd pain, Dx in [place] in early 2000 (EGD and sigmoidoscopy)  . Fatty liver     disease, dx in [place] early 2000  . Skin cancer     sees derm q 6 months   . Obstructive sleep apnea   . CAD (coronary artery disease)     Mild non-obstructive CAD per catrh   Past Surgical History  Procedure Laterality Date  . Right groin scc removed  2008    sees derm routinely per pt    History   Social History  . Marital Status: Married    Spouse Name: N/A    Number of Children: 1   . Years of Education: N/A   Occupational History  . IT, Physiological scientist    Social History Main Topics  . Smoking status: Former Games developer  . Smokeless tobacco: Never Used     Comment: quit 2008  . Alcohol Use: 3.6 - 4.8 oz/week    6-8 Cans of beer per week  . Drug Use: No  . Sexual Activity: Not on file   Other Topics Concern  . Not on file   Social History Narrative  . No narrative on file   Review of Systems Denies chest pain or sob No nausea, vomiting, diarrhea. Continue with paresthesias, already seen by neurology    Objective:   Physical Exam BP 137/81  Pulse 87  Temp(Src) 98 F (36.7 C)  Wt 248 lb (112.492 kg)  SpO2 96% General -- alert, well-developed, NAD.   Lungs -- normal respiratory effort, no intercostal retractions, no accessory muscle use, and normal breath sounds.    Heart-- normal rate, regular rhythm, no murmur.   Extremities-- trace  pretibial edema bilaterally  Neurologic--  alert & oriented X3. Speech normal, gait normal, strength normal in all extremities.  Psych-- Cognition and judgment appear intact. Cooperative with normal attention span and concentration. No anxious appearing , no depressed appearing.      Assessment & Plan:

## 2013-10-16 NOTE — Assessment & Plan Note (Signed)
On lipitor 10 mg, LDL ok, TG increased, life style not better. Plans to try again to improve diet-exercise, recheck on labs  RTC

## 2013-10-17 LAB — HEPATITIS B SURFACE ANTIGEN: Hepatitis B Surface Ag: NEGATIVE

## 2013-10-17 LAB — HEPATITIS B SURFACE ANTIBODY,QUALITATIVE: Hep B S Ab: NEGATIVE

## 2013-10-22 ENCOUNTER — Encounter: Payer: Self-pay | Admitting: *Deleted

## 2013-10-28 ENCOUNTER — Other Ambulatory Visit: Payer: Self-pay | Admitting: Internal Medicine

## 2013-10-31 ENCOUNTER — Telehealth: Payer: Self-pay | Admitting: *Deleted

## 2013-10-31 NOTE — Telephone Encounter (Signed)
UDS collected on 10/17/13 showed appropriate amounts of prescribed medication in patients system. Provider deemed low risk.

## 2013-12-11 ENCOUNTER — Encounter: Payer: Self-pay | Admitting: Internal Medicine

## 2014-04-12 ENCOUNTER — Telehealth: Payer: Self-pay | Admitting: Internal Medicine

## 2014-04-12 NOTE — Telephone Encounter (Signed)
Caller name:George Hernandez Relation to pt: patient Call back number: [phone] Pharmacy:  Reason for call: patient called to request a refill for Xanax but states if he has to do a urine specimen he does not want it. Patient is in between changing insurances and does not want to pay out of pocket. Please advise

## 2014-04-15 NOTE — Telephone Encounter (Signed)
Xanax 0.25mg  Last OV- 10/16/13 Last refilled- 10/16/13 #60 / 1 rf  UDS- 10/17/13 LOW risk

## 2014-04-16 MED ORDER — ALPRAZOLAM 0.25 MG PO TABS: ORAL_TABLET | ORAL | Status: DC

## 2014-04-16 NOTE — Telephone Encounter (Signed)
done

## 2014-04-16 NOTE — Telephone Encounter (Signed)
Rx for Xanax faxed to [facility] in [place]

## 2014-04-17 ENCOUNTER — Encounter: Payer: 59 | Admitting: Internal Medicine

## 2014-06-10 ENCOUNTER — Telehealth: Payer: Self-pay | Admitting: Internal Medicine

## 2014-06-10 MED ORDER — OLMESARTAN MEDOXOMIL 20 MG PO TABS: ORAL_TABLET | ORAL | Status: DC

## 2014-06-10 NOTE — Telephone Encounter (Signed)
Caller name: Vincient  Call back [phone] Pharmacy: [id number] - [place], [address]   Reason for call:  Pt changed insurances and needs the rx olmesartan (BENICAR) 20 MG resent to pharmacy before they will fill it .

## 2014-06-10 NOTE — Telephone Encounter (Signed)
rx sent

## 2014-06-13 ENCOUNTER — Telehealth: Payer: Self-pay | Admitting: *Deleted

## 2014-06-13 ENCOUNTER — Ambulatory Visit (INDEPENDENT_AMBULATORY_CARE_PROVIDER_SITE_OTHER): Payer: BC Managed Care – PPO | Admitting: Internal Medicine

## 2014-06-13 ENCOUNTER — Encounter: Payer: Self-pay | Admitting: Internal Medicine

## 2014-06-13 VITALS — BP 151/89 | HR 70 | Temp 98.5°F | Wt 233.5 lb

## 2014-06-13 DIAGNOSIS — I10 Essential (primary) hypertension: Secondary | ICD-10-CM

## 2014-06-13 MED ORDER — LOSARTAN POTASSIUM 100 MG PO TABS: 100.0000 mg | ORAL_TABLET | Freq: Every day | ORAL | Status: DC

## 2014-06-13 NOTE — Telephone Encounter (Signed)
Received prior authorization request for Benicar 20mg  via fax from [facility].  Spoke with the pharmacy and was informed a new script for Lorsartan 100mg   was sent in today for the pt,so the pt is not longer taking the Benicar and the prior authorization is not needed.  Spoke with the pt and he stated that the Benicar was to expensive so Dr. Larose Kells changed him to [facility] 100mg , and he is going to pick it up today.//AB/CMA

## 2014-06-13 NOTE — Assessment & Plan Note (Signed)
Off Benicar for 3 days, BP slightly elevated. benicar  become very expensive and needs another prescription. Plan: Losartan 100 mg, continue other medications, BMP in 2 or 3 weeks. Recommend to self monitor BPs, see instructions. Also has edema, recommend a low-salt diet.

## 2014-06-13 NOTE — Patient Instructions (Addendum)
Check the  blood pressure 2 or 3 times a week be sure it is between 110/60 and 140/85. Ideal blood pressure is 120/80. If it is consistently higher or lower, let me know   Come back in 2 -3 weeks for labs only BMP-- dx HTN

## 2014-06-13 NOTE — Progress Notes (Signed)
Subjective:    Patient ID: George Hernandez, male    DOB: 11/06/1961, 53 y.o.   MRN: [id number]  DOS:  06/13/2014 Type of visit - description: routine History: Here for hypertension management,Benicar is quite expensive needs a new prescription for an other medication. BP was well-controlled while  taking Benicar, out of it  for 3 days     ROS Denies chest pain, difficulty breathing. Denies edema but there is some edema on exam today, no orthopnea No nausea, vomiting, diarrhea  Past Medical History  Diagnosis Date  . Hyperlipemia   . Hypertension   . GERD (gastroesophageal reflux disease)     Hiatal hernia  . IBS (irritable bowel syndrome)     chronic upper abd pain, Dx in [place] in early 2000 (EGD and sigmoidoscopy)  . Fatty liver     disease, dx in [place] early 2000  . Skin cancer     sees derm q 6 months   . Obstructive sleep apnea   . CAD (coronary artery disease)     Mild non-obstructive CAD per catrh    Past Surgical History  Procedure Laterality Date  . Right groin scc removed  2008    sees derm routinely per pt     History   Social History  . Marital Status: Married    Spouse Name: N/A    Number of Children: 1   . Years of Education: N/A   Occupational History  . IT, Careers information officer    Social History Main Topics  . Smoking status: Former Research scientist (life sciences)  . Smokeless tobacco: Never Used     Comment: quit 2008  . Alcohol Use: 3.6 - 4.8 oz/week    6-8 Cans of beer per week  . Drug Use: No  . Sexual Activity: Not on file   Other Topics Concern  . Not on file   Social History Narrative  . No narrative on file        Medication List       This list is accurate as of: 06/13/14  5:26 PM.  Always use your most recent med list.               ALPRAZolam 0.25 MG tablet  Commonly known as:  XANAX  TAKE 1 TABLET BY MOUTH THREE TIMES DAILY AS NEEDED FOR ANXIETY     aspirin 81 MG tablet  Take 81 mg by mouth daily.     atorvastatin 10 MG tablet    Commonly known as:  LIPITOR  TAKE 1 TABLET BY MOUTH EVERY NIGHT AT BEDTIME     hydrochlorothiazide 25 MG tablet  Commonly known as:  HYDRODIURIL  TAKE 1 TABLET BY MOUTH DAILY     losartan 100 MG tablet  Commonly known as:  COZAAR  Take 1 tablet (100 mg total) by mouth daily.     multivitamin Tabs tablet  Take 1 tablet by mouth daily.     omeprazole 20 MG tablet  Commonly known as:  PRILOSEC OTC  Take 20 mg by mouth daily.           Objective:   Physical Exam BP 151/89  Pulse 70  Temp(Src) 98.5 F (36.9 C) (Oral)  Wt 233 lb 8 oz (105.915 kg)  SpO2 98% General -- alert, well-developed, NAD.  Lungs -- normal respiratory effort, no intercostal retractions, no accessory muscle use, and normal breath sounds.  Heart-- normal rate, regular rhythm, no murmur.  Extremities-- n+/+++ pretibial edema bilaterally  Neurologic--  alert & oriented X3.   Psych-- Cognition and judgment appear intact. Cooperative with normal attention span and concentration. No anxious or depressed appearing.     Assessment & Plan:

## 2014-06-14 ENCOUNTER — Telehealth: Payer: Self-pay | Admitting: Internal Medicine

## 2014-06-14 NOTE — Telephone Encounter (Signed)
Relevant patient education assigned to patient using Emmi. ° °

## 2014-07-04 ENCOUNTER — Other Ambulatory Visit (INDEPENDENT_AMBULATORY_CARE_PROVIDER_SITE_OTHER): Payer: BC Managed Care – PPO

## 2014-07-04 DIAGNOSIS — I10 Essential (primary) hypertension: Secondary | ICD-10-CM

## 2014-07-04 LAB — BASIC METABOLIC PANEL
BUN: 11 mg/dL (ref 6–23)
CALCIUM: 9.1 mg/dL (ref 8.4–10.5)
CHLORIDE: 100 meq/L (ref 96–112)
CO2: 30 meq/L (ref 19–32)
Creatinine, Ser: 0.9 mg/dL (ref 0.4–1.5)
GFR: 92.05 mL/min (ref >60.00)
Glucose, Bld: 86 mg/dL (ref 70–99)
Potassium: 3.5 mEq/L (ref 3.5–5.1)
SODIUM: 139 meq/L (ref 135–145)

## 2014-07-07 ENCOUNTER — Other Ambulatory Visit: Payer: Self-pay | Admitting: Internal Medicine

## 2014-08-26 ENCOUNTER — Encounter: Payer: 59 | Admitting: Internal Medicine

## 2014-08-29 ENCOUNTER — Other Ambulatory Visit: Payer: Self-pay

## 2014-09-09 ENCOUNTER — Other Ambulatory Visit: Payer: Self-pay

## 2014-09-09 MED ORDER — LOSARTAN POTASSIUM 100 MG PO TABS: 100.0000 mg | ORAL_TABLET | Freq: Every day | ORAL | Status: DC

## 2014-09-12 ENCOUNTER — Encounter: Payer: Self-pay | Admitting: Internal Medicine

## 2014-09-12 ENCOUNTER — Ambulatory Visit (INDEPENDENT_AMBULATORY_CARE_PROVIDER_SITE_OTHER): Payer: BC Managed Care – PPO | Admitting: Internal Medicine

## 2014-09-12 VITALS — BP 157/89 | HR 82 | Temp 98.2°F | Ht 69.0 in | Wt 231.5 lb

## 2014-09-12 DIAGNOSIS — H10021 Other mucopurulent conjunctivitis, right eye: Secondary | ICD-10-CM

## 2014-09-12 DIAGNOSIS — Z0000 Encounter for general adult medical examination without abnormal findings: Secondary | ICD-10-CM

## 2014-09-12 DIAGNOSIS — I10 Essential (primary) hypertension: Secondary | ICD-10-CM

## 2014-09-12 DIAGNOSIS — G5710 Meralgia paresthetica, unspecified lower limb: Secondary | ICD-10-CM

## 2014-09-12 LAB — LIPID PANEL
Cholesterol: 185 mg/dL (ref 0–200)
HDL: 47.5 mg/dL (ref >39.00)
LDL CALC: 103 mg/dL — AB (ref 0–99)
NonHDL: 137.5
TRIGLYCERIDES: 174 mg/dL — AB (ref 0.0–149.0)
Total CHOL/HDL Ratio: 4
VLDL: 34.8 mg/dL (ref 0.0–40.0)

## 2014-09-12 LAB — CBC WITH DIFFERENTIAL/PLATELET
Basophils Absolute: 0 10*3/uL (ref 0.0–0.1)
Basophils Relative: 0.4 % (ref 0.0–3.0)
EOS ABS: 0.1 10*3/uL (ref 0.0–0.7)
Eosinophils Relative: 0.8 % (ref 0.0–5.0)
HCT: 45.5 % (ref 39.0–52.0)
HEMOGLOBIN: 15.3 g/dL (ref 13.0–17.0)
Lymphocytes Relative: 20.8 % (ref 12.0–46.0)
Lymphs Abs: 1.5 10*3/uL (ref 0.7–4.0)
MCHC: 33.5 g/dL (ref 30.0–36.0)
MCV: 98.9 fl (ref 78.0–100.0)
MONO ABS: 0.7 10*3/uL (ref 0.1–1.0)
Monocytes Relative: 9.4 % (ref 3.0–12.0)
NEUTROS ABS: 4.9 10*3/uL (ref 1.4–7.7)
Neutrophils Relative %: 68.6 % (ref 43.0–77.0)
Platelets: 199 10*3/uL (ref 150.0–400.0)
RBC: 4.61 Mil/uL (ref 4.22–5.81)
RDW: 13.3 % (ref 11.5–15.5)
WBC: 7.2 10*3/uL (ref 4.0–10.5)

## 2014-09-12 LAB — TSH: TSH: 1.63 u[IU]/mL (ref 0.35–4.50)

## 2014-09-12 LAB — PSA: PSA: 0.72 ng/mL (ref 0.10–4.00)

## 2014-09-12 MED ORDER — LOSARTAN POTASSIUM-HCTZ 100-25 MG PO TABS: 1.0000 | ORAL_TABLET | Freq: Every day | ORAL | Status: DC

## 2014-09-12 MED ORDER — TOBRAMYCIN 0.3 % OP SOLN: 2.0000 [drp] | Freq: Four times a day (QID) | OPHTHALMIC | Status: DC

## 2014-09-12 MED ORDER — CARVEDILOL 12.5 MG PO TABS: 12.5000 mg | ORAL_TABLET | Freq: Two times a day (BID) | ORAL | Status: DC

## 2014-09-12 NOTE — Patient Instructions (Signed)
Get your blood work before you leave   Change losartan and hydrochlorothiazide on a single tablet ======> losartan HCT Add carvedilol  twice a day  Check the  blood pressure 2 or 3 times a week  Be sure your blood pressure is between  145/85  and 110/65.  if it is consistently higher or lower, let me know   Take eyedrops for 3 days, if not better see the eye doctor   Please come back to the office in 4 months  for a routine check up   Depending on your labs,  you may need to come back  fasting Schedule the visit at the front desk

## 2014-09-12 NOTE — Progress Notes (Signed)
Pre visit review using our clinic review tool, if applicable. No additional management support is needed unless otherwise documented below in the visit note. 

## 2014-09-12 NOTE — Assessment & Plan Note (Signed)
Currently on losartan100 mg and HCTZ see 25 mg, needs better control. Plan: Add coreg Consolidate losartan + HCT in a single tablet to facilitate compliance See instructions

## 2014-09-12 NOTE — Assessment & Plan Note (Addendum)
Continue with occasional paresthesias of the fourth and fifth left toes. This is on and off for years. Saw neurology in 2013, chart reviewed---> diagnosed with possible   neuropathy, meralgia paresthetica and ? Of  S1 radiculopathy. They rec observation Plan: Observation

## 2014-09-12 NOTE — Assessment & Plan Note (Addendum)
Td 2008  zostavax discussed before Flu shot-- declined   had a Cscope 2011-- had bx, told they were ok, next per GI (will call them next year) Also had a EGD 2011 at [facility], also a EUS EGD @ [facility], had a bx---benign findings   DRE neg, check a PSA Lifestyle : a lot healthier, much more active, has loose some weight.  Labs

## 2014-09-12 NOTE — Progress Notes (Signed)
Subjective:    Patient ID: George Hernandez, male    DOB: 1961-02-18, 53 y.o.   MRN: [id number]  DOS:  09/12/2014 Type of visit - description : cpx Interval history: BP-- BP today slightly elevated, at home is 140/ 80, 90. Good compliance with medication For the last 2 days the right eye is watery. vision is normal, mildly itchy, mildly red. Does not suspect any FB Continue with on and off 4-5th left toe numbness, no back pain, see assessment and plan  ROS No  CP, SOB  Denies  nausea, vomiting diarrhea, blood in the stools (-) cough, sputum production (-) wheezing, chest congestion  No dysuria, gross hematuria, difficulty urinating   No anxiety, depression (on xanax prn, takes it rarely  Past Medical History  Diagnosis Date  . Hyperlipemia   . Hypertension   . GERD (gastroesophageal reflux disease)     Hiatal hernia  . IBS (irritable bowel syndrome)     chronic upper abd pain, Dx in [place] in early 2000 (EGD and sigmoidoscopy)  . Fatty liver     disease, dx in [place] early 2000  . Skin cancer     sees derm q 6 months   . Obstructive sleep apnea   . CAD (coronary artery disease)     Mild non-obstructive CAD per catrh    Past Surgical History  Procedure Laterality Date  . Right groin scc removed  2008    sees derm routinely per pt     History   Social History  . Marital Status: Married    Spouse Name: N/A    Number of Children: 1   . Years of Education: N/A   Occupational History  . IT, Careers information officer    Social History Main Topics  . Smoking status: Former Research scientist (life sciences)  . Smokeless tobacco: Never Used     Comment: quit 2008  . Alcohol Use: 3.6 - 4.8 oz/week    6-8 Cans of beer per week  . Drug Use: No  . Sexual Activity: Not on file   Other Topics Concern  . Not on file   Social History Narrative   Household-- pt, wife and child     Family History  Problem Relation Age of Onset  . Diabetes Mother   . Hypertension Mother   . Stroke Father 72  .  Colon cancer Neg Hx   . Prostate cancer Neg Hx        Medication List       This list is accurate as of: 09/12/14 11:59 PM.  Always use your most recent med list.               ALPRAZolam 0.25 MG tablet  Commonly known as:  XANAX  TAKE 1 TABLET BY MOUTH THREE TIMES DAILY AS NEEDED FOR ANXIETY     aspirin 81 MG tablet  Take 81 mg by mouth daily.     atorvastatin 10 MG tablet  Commonly known as:  LIPITOR  TAKE 1 TABLET BY MOUTH EVERY NIGHT AT BEDTIME     carvedilol 12.5 MG tablet  Commonly known as:  COREG  Take 1 tablet (12.5 mg total) by mouth 2 (two) times daily with a meal.     losartan-hydrochlorothiazide 100-25 MG per tablet  Commonly known as:  HYZAAR  Take 1 tablet by mouth daily.     multivitamin Tabs tablet  Take 1 tablet by mouth daily.     omeprazole 20 MG tablet  Commonly known  as:  PRILOSEC OTC  Take 20 mg by mouth daily.     tobramycin 0.3 % ophthalmic solution  Commonly known as:  TOBREX  Place 2 drops into the right eye every 6 (six) hours.           Objective:   Physical Exam BP 157/89 mmHg  Pulse 82  Temp(Src) 98.2 F (36.8 C) (Oral)  Ht 5\' 9"  (1.753 m)  Wt 231 lb 8 oz (105.008 kg)  BMI 34.17 kg/m2  SpO2 95%  General -- alert, well-developed, NAD.  Neck --no thyromegaly  HEENT-- Not pale.  R eye- slightly watery, minimal redness, chamber normal L eye- normal EOMI-PERLA Lungs -- normal respiratory effort, no intercostal retractions, no accessory muscle use, and normal breath sounds.  Heart-- normal rate, regular rhythm, no murmur.  Abdomen-- Not distended, good bowel sounds,soft, non-tender. No rebound or rigidity.   Rectal-- No external abnormalities noted. Normal sphincter tone. No rectal masses or tenderness. Stool brown  Prostate--Prostate gland firm and smooth, no enlargement, nodularity, tenderness, mass, asymmetry or induration. Extremities-- trace pretibial edema bilaterally  Neurologic--  alert & oriented X3. Speech  normal, gait appropriate for age, strength symmetric and appropriate for age.  Psych-- Cognition and judgment appear intact. Cooperative with normal attention span and concentration. No anxious or depressed appearing.     Assessment & Plan:    [place]? Prescribed eyedrops, if not better he needs to see the eye doctor within 3-4 days  In addition to the physical exam,  Spent 15 minutes managing two chronic problems (hypertension and paresthesias) and one acute problem

## 2014-09-19 ENCOUNTER — Telehealth: Payer: Self-pay

## 2014-09-19 NOTE — Telephone Encounter (Signed)
UDS: 09/12/2014  Negative for Xanax: PRN   Low risk per Dr. Larose Kells 09/19/2014

## 2014-10-01 ENCOUNTER — Encounter: Payer: Self-pay | Admitting: Internal Medicine

## 2014-10-08 ENCOUNTER — Other Ambulatory Visit: Payer: Self-pay

## 2015-01-13 ENCOUNTER — Ambulatory Visit: Payer: BC Managed Care – PPO | Admitting: Internal Medicine

## 2015-01-16 ENCOUNTER — Other Ambulatory Visit: Payer: Self-pay | Admitting: Internal Medicine

## 2015-02-17 ENCOUNTER — Telehealth: Payer: Self-pay | Admitting: Internal Medicine

## 2015-02-17 ENCOUNTER — Other Ambulatory Visit: Payer: Self-pay | Admitting: Internal Medicine

## 2015-02-17 NOTE — Telephone Encounter (Signed)
Relation to pt: self  Call back number: [phone] Pharmacy: [id number] - [place], [place] [phone] (Phone) [phone] (Fax)         Reason for call:  Pt requesting a refill atorvastatin (LIPITOR) 10 MG tablet

## 2015-02-17 NOTE — Telephone Encounter (Signed)
[facility] # 30 tablets and 1 refill. Pt should still have 1 refill remaining.

## 2015-02-17 NOTE — Telephone Encounter (Signed)
Pt advised.

## 2015-03-10 ENCOUNTER — Encounter: Payer: Self-pay | Admitting: Internal Medicine

## 2015-03-10 ENCOUNTER — Ambulatory Visit (INDEPENDENT_AMBULATORY_CARE_PROVIDER_SITE_OTHER): Payer: BLUE CROSS/BLUE SHIELD | Admitting: Internal Medicine

## 2015-03-10 VITALS — BP 128/82 | HR 61 | Temp 98.2°F | Ht 69.0 in | Wt 238.0 lb

## 2015-03-10 DIAGNOSIS — I10 Essential (primary) hypertension: Secondary | ICD-10-CM

## 2015-03-10 DIAGNOSIS — F419 Anxiety disorder, unspecified: Secondary | ICD-10-CM

## 2015-03-10 LAB — BASIC METABOLIC PANEL
BUN: 13 mg/dL (ref 6–23)
CALCIUM: 9.1 mg/dL (ref 8.4–10.5)
CHLORIDE: 103 meq/L (ref 96–112)
CO2: 31 mEq/L (ref 19–32)
CREATININE: 1.12 mg/dL (ref 0.40–1.50)
GFR: 72.25 mL/min (ref >60.00)
GLUCOSE: 99 mg/dL (ref 70–99)
Potassium: 4.1 mEq/L (ref 3.5–5.1)
Sodium: 140 mEq/L (ref 135–145)

## 2015-03-10 MED ORDER — LOSARTAN POTASSIUM-HCTZ 100-25 MG PO TABS: 1.0000 | ORAL_TABLET | Freq: Every day | ORAL | Status: DC

## 2015-03-10 MED ORDER — ATORVASTATIN CALCIUM 10 MG PO TABS: 10.0000 mg | ORAL_TABLET | Freq: Every day | ORAL | Status: DC

## 2015-03-10 MED ORDER — CARVEDILOL 12.5 MG PO TABS: 12.5000 mg | ORAL_TABLET | Freq: Two times a day (BID) | ORAL | Status: DC

## 2015-03-10 NOTE — Assessment & Plan Note (Signed)
The patient forgets occasionally the second dose of carvedilol. Also noted more edema since we put together hydrochlorothiazide w/ the ARB. We talk about the strategies to prevent edema such as leg elevation,  low-salt diet. We'll continue with the same regimen for now, will try to be more compliant with carvedilol (although he forgets it rarely) Reassess the situation when he comes back.  Check a BMP, refill medications

## 2015-03-10 NOTE — Progress Notes (Signed)
Subjective:    Patient ID: George Hernandez, male    DOB: Dec 01, 1960, 54 y.o.   MRN: [id number]  DOS:  03/10/2015 Type of visit - description : rov Interval history: Hypertension, good compliance of medication, except for carvedilol, he forgets the second dose 2- 3 times a week. Ambulatory BPs remain very good. Has noted some edema, wonders if related to the fact that hydrochlorothiazide is not a separate tablet anymore . Not needing Xanax for the last few months   Review of Systems Denies chest pain or difficulty breathing. No nausea, vomiting, diarrhea. Doing well with exercise, takes walks frequently.  Past Medical History  Diagnosis Date  . Hyperlipemia   . Hypertension   . GERD (gastroesophageal reflux disease)     Hiatal hernia  . IBS (irritable bowel syndrome)     chronic upper abd pain, Dx in [place] in early 2000 (EGD and sigmoidoscopy)  . Fatty liver     disease, dx in [place] early 2000  . Skin cancer     sees derm q 6 months   . Obstructive sleep apnea   . CAD (coronary artery disease)     Mild non-obstructive CAD per catrh    Past Surgical History  Procedure Laterality Date  . Right groin scc removed  2008    sees derm routinely per pt     History   Social History  . Marital Status: Married    Spouse Name: N/A  . Number of Children: 1   . Years of Education: N/A   Occupational History  . IT, Careers information officer    Social History Main Topics  . Smoking status: Former Research scientist (life sciences)  . Smokeless tobacco: Never Used     Comment: quit 2008  . Alcohol Use: 3.6 - 4.8 oz/week    6-8 Cans of beer per week  . Drug Use: No  . Sexual Activity: Not on file   Other Topics Concern  . Not on file   Social History Narrative   Household-- pt, wife and child        Medication List       This list is accurate as of: 03/10/15  8:20 PM.  Always use your most recent med list.               ALPRAZolam 0.25 MG tablet  Commonly known as:  XANAX  TAKE 1 TABLET  BY MOUTH THREE TIMES DAILY AS NEEDED FOR ANXIETY     aspirin 81 MG tablet  Take 81 mg by mouth daily.     atorvastatin 10 MG tablet  Commonly known as:  LIPITOR  Take 1 tablet (10 mg total) by mouth at bedtime.     carvedilol 12.5 MG tablet  Commonly known as:  COREG  Take 1 tablet (12.5 mg total) by mouth 2 (two) times daily with a meal.     losartan-hydrochlorothiazide 100-25 MG per tablet  Commonly known as:  HYZAAR  Take 1 tablet by mouth daily.     multivitamin Tabs tablet  Take 1 tablet by mouth daily.     omeprazole 20 MG tablet  Commonly known as:  PRILOSEC OTC  Take 20 mg by mouth daily.           Objective:   Physical Exam BP 128/82 mmHg  Pulse 61  Temp(Src) 98.2 F (36.8 C) (Oral)  Ht 5\' 9"  (1.753 m)  Wt 238 lb (107.956 kg)  BMI 35.13 kg/m2  SpO2 98% General:   Well  developed, well nourished . NAD.  HEENT:  Normocephalic . Face symmetric, atraumatic Lungs:  CTA B Normal respiratory effort, no intercostal retractions, no accessory muscle use. Heart: RRR,  no murmur.  Muscle skeletal: Trace  pretibial edema bilaterally  Skin: Not pale. Not jaundice Neurologic:  alert & oriented X3.  Speech normal, gait appropriate for age and unassisted Psych--  Cognition and judgment appear intact.  Cooperative with normal attention span and concentration.  Behavior appropriate. No anxious or depressed appearing.       Assessment & Plan:

## 2015-03-10 NOTE — Patient Instructions (Signed)
Get your blood work before you leave    Come back to the office by 09-2015   for a physical exam  Please schedule an appointment at the front desk    Come back fasting        

## 2015-03-10 NOTE — Assessment & Plan Note (Signed)
Doing well, has not taken Xanax in the last few months

## 2015-03-10 NOTE — Progress Notes (Signed)
Pre visit review using our clinic review tool, if applicable. No additional management support is needed unless otherwise documented below in the visit note. 

## 2015-07-01 ENCOUNTER — Telehealth: Payer: Self-pay | Admitting: Internal Medicine

## 2015-07-01 NOTE — Telephone Encounter (Signed)
Caller name: Marsh Heckler Relationship to patient: self Can be reached: [phone] [facility] [address]  Reason for call: Pt previously dc meds but now he has changed jobs and is traveling/flying quite a bit which gives him anxiety. He is asking for refill on xanax. He is out, but it is not urgent. Pt flying to [facility] this weekend and would like filled before then. Last visit in 03/2015 and cpe scheduled for 09/2015. Please call pt to notify when sent in

## 2015-07-01 NOTE — Telephone Encounter (Signed)
Pt is requesting refill on Alprazolam-Pt uses for traveling. Flying to [place] this weekend  Last OV: 03/10/2015 Last Fill: 04/16/2014 #60 1RF UDS: 09/12/2014 Low risk  Please advise.

## 2015-07-01 NOTE — Telephone Encounter (Signed)
Okay #60 and one refill 

## 2015-07-02 MED ORDER — ALPRAZOLAM 0.25 MG PO TABS: 0.2500 mg | ORAL_TABLET | Freq: Three times a day (TID) | ORAL | Status: DC | PRN

## 2015-07-02 NOTE — Telephone Encounter (Signed)
LMOM informing Pt that Rx has been faxed to [facility] on [address] Informed him to call if he has any questions or concerns.

## 2015-07-02 NOTE — Telephone Encounter (Signed)
Rx printed, awaiting MD signature.  

## 2015-09-19 ENCOUNTER — Encounter: Payer: BLUE CROSS/BLUE SHIELD | Admitting: Internal Medicine

## 2015-11-20 ENCOUNTER — Ambulatory Visit (INDEPENDENT_AMBULATORY_CARE_PROVIDER_SITE_OTHER): Payer: BLUE CROSS/BLUE SHIELD | Admitting: Internal Medicine

## 2015-11-20 ENCOUNTER — Encounter: Payer: Self-pay | Admitting: Internal Medicine

## 2015-11-20 VITALS — BP 142/80 | HR 82 | Temp 97.9°F | Ht 69.0 in | Wt 252.2 lb

## 2015-11-20 DIAGNOSIS — M549 Dorsalgia, unspecified: Secondary | ICD-10-CM

## 2015-11-20 DIAGNOSIS — F419 Anxiety disorder, unspecified: Secondary | ICD-10-CM

## 2015-11-20 DIAGNOSIS — M546 Pain in thoracic spine: Secondary | ICD-10-CM | POA: Diagnosis not present

## 2015-11-20 DIAGNOSIS — I10 Essential (primary) hypertension: Secondary | ICD-10-CM | POA: Diagnosis not present

## 2015-11-20 MED ORDER — CYCLOBENZAPRINE HCL 10 MG PO TABS: 10.0000 mg | ORAL_TABLET | Freq: Every evening | ORAL | Status: DC | PRN

## 2015-11-20 MED ORDER — PREDNISONE 10 MG PO TABS: ORAL_TABLET | ORAL | Status: DC

## 2015-11-20 NOTE — Patient Instructions (Signed)
Please go to the lab and provide samples for a UDS  Take  prednisone as prescribed for few days  Take Flexeril, a muscle relaxant at night . Will cause drowsiness   Do the stretching twice a day  See you next month for a physical exam.

## 2015-11-20 NOTE — Progress Notes (Signed)
Subjective:    Patient ID: George Hernandez, male    DOB: 11/03/1961, 55 y.o.   MRN: [id number]  DOS:  11/20/2015 Type of visit - description : Acute visit Interval history: Complaining of a pain at the right upper thoracic area, symptoms started after he was driving back from [place] in November. The pain is a steady ache with no radiation and worse with certain movements, also quite noticeable at night. NSAIDs helped very little. No associated symptoms such as chest pain or difficulty breathing. BP slightly elevated today, ambulatory BPs palpation are very good. Good compliance with medications except that he forgets sometimes the second dose of carvedilol.   Review of Systems No neck pain per se, no upper or lower extremity paresthesias at this time.  Past Medical History  Diagnosis Date  . Hyperlipemia   . Hypertension   . GERD (gastroesophageal reflux disease)     Hiatal hernia  . IBS (irritable bowel syndrome)     chronic upper abd pain, Dx in [place] in early 2000 (EGD and sigmoidoscopy)  . Fatty liver     disease, dx in [place] early 2000  . Skin cancer     sees derm q 6 months   . Obstructive sleep apnea   . CAD (coronary artery disease)     Mild non-obstructive CAD per [facility]    Past Surgical History  Procedure Laterality Date  . Right groin scc removed  2008    sees derm routinely per pt     Social History   Social History  . Marital Status: Married    Spouse Name: N/A  . Number of Children: 1   . Years of Education: N/A   Occupational History  . IT, Careers information officer    Social History Main Topics  . Smoking status: Former Research scientist (life sciences)  . Smokeless tobacco: Never Used     Comment: quit 2008  . Alcohol Use: 3.6 - 4.8 oz/week    6-8 Cans of beer per week  . Drug Use: No  . Sexual Activity: Not on file   Other Topics Concern  . Not on file   Social History Narrative   Household-- pt, wife and child        Medication List       This list is  accurate as of: 11/20/15 10:11 AM.  Always use your most recent med list.               ALPRAZolam 0.25 MG tablet  Commonly known as:  XANAX  Take 1 tablet (0.25 mg total) by mouth 3 (three) times daily as needed for anxiety.     aspirin 81 MG tablet  Take 81 mg by mouth daily.     atorvastatin 10 MG tablet  Commonly known as:  LIPITOR  Take 1 tablet (10 mg total) by mouth at bedtime.     carvedilol 12.5 MG tablet  Commonly known as:  COREG  Take 1 tablet (12.5 mg total) by mouth 2 (two) times daily with a meal.     cyclobenzaprine 10 MG tablet  Commonly known as:  FLEXERIL  Take 1 tablet (10 mg total) by mouth at bedtime as needed for muscle spasms.     FIBER PO  Take 1 tablet by mouth daily.     losartan-hydrochlorothiazide 100-25 MG tablet  Commonly known as:  HYZAAR  Take 1 tablet by mouth daily.     multivitamin Tabs tablet  Take 1 tablet by mouth daily.  omeprazole 20 MG tablet  Commonly known as:  PRILOSEC OTC  Take 20 mg by mouth daily. Reported on 11/20/2015     predniSONE 10 MG tablet  Commonly known as:  DELTASONE  4 tablets x 2 days, 3 tabs x 2 days, 2 tabs x 2 days, 1 tab x 2 days           Objective:   Physical Exam  Musculoskeletal:       Arms:  BP 142/80 mmHg  Pulse 82  Temp(Src) 97.9 F (36.6 C) (Oral)  Ht 5\' 9"  (1.753 m)  Wt 252 lb 4 oz (114.42 kg)  BMI 37.23 kg/m2  SpO2 98% General:   Well developed, well nourished . NAD.  HEENT:  Normocephalic . Face symmetric, atraumatic Neck: Full range of motion, no TTP. MSK: Shoulders full range of motion without pain Skin: Not pale. Not jaundice Neurologic:  alert & oriented X3.  Speech normal, gait appropriate for age and unassisted. DTRs and motor exam symmetric Psych--  Cognition and judgment appear intact.  Cooperative with normal attention span and concentration.  Behavior appropriate. No anxious or depressed appearing.      Assessment & Plan:    Assessment Prediabetes HTN Hyperlipidemia Anxiety Nonobstructive CAD by cath GI: --GERD, HH --IBS: Chronic upper abdominal pain, DX in [place]~2,000, had a EGD/sigmoidoscopy --Fatty liver DX 2000 --Cscope 2011-- had bx, told they were ok --EGD 2011 at [facility], also a EUS EGD @ [facility], had a bx---benign findings  OSA SCC, right groin, removed 2008, sees dermatology Paresthesias, saw neurology 2013: Neuropathy? Meralgia paresthetica? Radiculopathy? Rx observation  PLAN Upper back sprain: 3  stretching exercises taught to the patient. Prednisone, Flexeril, will reassess response when he comes backs next month for his CPX. HTN: Good ambulatory BPs despite sometimes forgetting the second dose of carvedilol. Anxiety: Contract signed today for Xanax, UDS RTC as scheduled for a CPX next month.

## 2015-11-20 NOTE — Progress Notes (Signed)
Pre visit review using our clinic review tool, if applicable. No additional management support is needed unless otherwise documented below in the visit note. 

## 2015-11-28 ENCOUNTER — Telehealth: Payer: Self-pay

## 2015-11-28 NOTE — Telephone Encounter (Signed)
UDS: 11/20/2015  Negative for Alprazolam:PRN   Low risk per Dr. Larose Kells 11/28/2015

## 2015-12-18 ENCOUNTER — Encounter: Payer: Self-pay | Admitting: Internal Medicine

## 2015-12-22 ENCOUNTER — Encounter: Payer: Self-pay | Admitting: *Deleted

## 2015-12-22 ENCOUNTER — Telehealth: Payer: Self-pay | Admitting: *Deleted

## 2015-12-22 NOTE — Telephone Encounter (Signed)
Pre-Visit Call completed with patient and chart updated.   Pre-Visit Info documented in Specialty Comments under SnapShot.    

## 2015-12-23 ENCOUNTER — Ambulatory Visit (INDEPENDENT_AMBULATORY_CARE_PROVIDER_SITE_OTHER): Payer: BLUE CROSS/BLUE SHIELD | Admitting: Internal Medicine

## 2015-12-23 ENCOUNTER — Encounter: Payer: Self-pay | Admitting: Internal Medicine

## 2015-12-23 VITALS — BP 126/74 | HR 71 | Temp 98.2°F | Ht 69.0 in | Wt 256.0 lb

## 2015-12-23 DIAGNOSIS — E785 Hyperlipidemia, unspecified: Secondary | ICD-10-CM

## 2015-12-23 DIAGNOSIS — Z114 Encounter for screening for human immunodeficiency virus [HIV]: Secondary | ICD-10-CM

## 2015-12-23 DIAGNOSIS — Z0000 Encounter for general adult medical examination without abnormal findings: Secondary | ICD-10-CM | POA: Diagnosis not present

## 2015-12-23 DIAGNOSIS — I10 Essential (primary) hypertension: Secondary | ICD-10-CM

## 2015-12-23 DIAGNOSIS — Z09 Encounter for follow-up examination after completed treatment for conditions other than malignant neoplasm: Secondary | ICD-10-CM

## 2015-12-23 NOTE — Progress Notes (Signed)
Subjective:    Patient ID: George Hernandez, male    DOB: 1961/02/12, 55 y.o.   MRN: [id number]  DOS:  12/23/2015 Type of visit - description : CPX Interval history: No major concerns   Review of Systems  Constitutional: No fever. No chills. No unexplained wt changes. No unusual sweats  HEENT: No dental problems, no ear discharge, no facial swelling, no voice changes. No eye discharge, no eye  redness , no  intolerance to light   Respiratory: No wheezing , no  difficulty breathing. No cough , no mucus production  Cardiovascular: No CP, no leg swelling , no  Palpitations  GI: no nausea, no vomiting, no diarrhea , no  abdominal pain.  No blood in the stools. No dysphagia, no odynophagia    Endocrine: No polyphagia, no polyuria , no polydipsia  GU: No dysuria, gross hematuria, difficulty urinating. No urinary urgency, no frequency.  Musculoskeletal: No joint swellings. Back pain improved  Skin: No change in the color of the skin, palor , no  Rash  Allergic, immunologic: No environmental allergies , no  food allergies  Neurological: No dizziness no  syncope. No headaches. No diplopia, no slurred, no slurred speech, no motor deficits, no facial  Numbness  Hematological: No enlarged lymph nodes, no easy bruising , no unusual bleedings  Psychiatry: No suicidal ideas, no hallucinations, no beavior problems, no confusion.  No unusual/severe anxiety, no depression  Past Medical History  Diagnosis Date  . Hyperlipemia   . Hypertension   . GERD (gastroesophageal reflux disease)     Hiatal hernia  . IBS (irritable bowel syndrome)     chronic upper abd pain, Dx in [place] in early 2000 (EGD and sigmoidoscopy)  . Fatty liver     disease, dx in [place] early 2000  . Skin cancer     sees derm q 6 months   . Obstructive sleep apnea   . CAD (coronary artery disease)     Mild non-obstructive CAD per catrh    Past Surgical History  Procedure Laterality Date  . Right groin  scc removed  2008    sees derm routinely per pt     Social History   Social History  . Marital Status: Married    Spouse Name: N/A  . Number of Children: 1   . Years of Education: N/A   Occupational History  . IT, Careers information officer    Social History Main Topics  . Smoking status: Former Research scientist (life sciences)  . Smokeless tobacco: Never Used     Comment: quit 2008  . Alcohol Use: 3.6 - 4.8 oz/week    6-8 Cans of beer per week  . Drug Use: No  . Sexual Activity: Not on file   Other Topics Concern  . Not on file   Social History Narrative   Household-- pt, wife and child   Family History  Problem Relation Age of Onset  . Diabetes Mother   . Hypertension Mother   . Stroke Father 75  . Colon cancer Neg Hx   . Prostate cancer Neg Hx   . CAD Other     GF       Medication List       This list is accurate as of: 12/23/15 11:59 PM.  Always use your most recent med list.               ALPRAZolam 0.25 MG tablet  Commonly known as:  XANAX  Take 1 tablet (0.25 mg  total) by mouth 3 (three) times daily as needed for anxiety.     aspirin 81 MG tablet  Take 81 mg by mouth daily.     atorvastatin 10 MG tablet  Commonly known as:  LIPITOR  Take 5 mg by mouth daily.     carvedilol 12.5 MG tablet  Commonly known as:  COREG  Take 1 tablet (12.5 mg total) by mouth 2 (two) times daily with a meal.     FIBER PO  Take 1 tablet by mouth daily.     losartan-hydrochlorothiazide 100-25 MG tablet  Commonly known as:  HYZAAR  Take 1 tablet by mouth daily.     multivitamin Tabs tablet  Take 1 tablet by mouth daily.     omeprazole 20 MG tablet  Commonly known as:  PRILOSEC OTC  Take 20 mg by mouth daily. Reported on 11/20/2015           Objective:   Physical Exam BP 126/74 mmHg  Pulse 71  Temp(Src) 98.2 F (36.8 C) (Oral)  Ht 5\' 9"  (1.753 m)  Wt 256 lb (116.121 kg)  BMI 37.79 kg/m2  SpO2 99% General:   Well developed, well nourished . NAD.  HEENT:  Normocephalic . Face  symmetric, atraumatic Neck: No thyromegaly Lungs:  CTA B Normal respiratory effort, no intercostal retractions, no accessory muscle use. Heart: RRR,  no murmur.  no pretibial edema bilaterally  Abdomen:  Not distended, soft, non-tender. No rebound or rigidity. No mass,organomegaly Skin: Not pale. Not jaundice Neurologic:  alert & oriented X3.  Speech normal, gait appropriate for age and unassisted Psych--  Cognition and judgment appear intact.  Cooperative with normal attention span and concentration.  Behavior appropriate. No anxious or depressed appearing.    Assessment & Plan:   Assessment Prediabetes HTN Hyperlipidemia Anxiety- xanax, low risk UDS 1-2017Nonobstructive CAD by cath Morbid  obesity (BMI 37  and pre-DM, HTN, hyperlipidemia GI: --GERD, HH --IBS: Chronic upper abdominal pain, DX in [place]~2,000, had a EGD/sigmoidoscopy --Fatty liver DX 2000 --Cscope 2011-- had bx, told they were ok, Dr Rolm Bookbinder --EGD 2011 at [facility], also a EUS EGD @ [facility], had a bx---benign findings  OSA SCC, right groin, removed 2008, sees dermatology Paresthesias, saw neurology 2013: Neuropathy? Meralgia paresthetica? Radiculopathy? Rx observation  PLAN Upper back sprain: Better after prednisone but not completely well, not taking a muscle relaxant, on retrospect, since November he is doing a lot more work at his computer and  thinks that is the etiology of his pain. For now recommend conservative treatment with prnTylenol-ibuprofen and call if not better in the next 2 or 3 months. HTN: Well-controlled History of SCC, sees dermatology regularly RTC 6 months, routine checkup.

## 2015-12-23 NOTE — Progress Notes (Signed)
Pre visit review using our clinic review tool, if applicable. No additional management support is needed unless otherwise documented below in the visit note. 

## 2015-12-23 NOTE — Patient Instructions (Signed)
  GO TO THE FRONT DESK Schedule labs to be done within few days (fasting)  Schedule a routine office visit or check up to be done in  6 months  No  fasting   Back pain: Continue stretching and as needed Tylenol or Motrin. Call if not getting better next 2 or 3 months

## 2015-12-23 NOTE — Assessment & Plan Note (Signed)
Td 2008 ; zostavax discussed before Flu shot-- declined   had a Cscope 2011-- had bx, told they were ok, will call [facility]: Dr Rolm Bookbinder, next cscope?   DRE neg 2015, PSA wnl-- reassess next year Lifestyle : Diet and exercise discussed extensively Labs: CMP, FLP, CBC, A1c, TSH, HIV

## 2015-12-24 DIAGNOSIS — Z09 Encounter for follow-up examination after completed treatment for conditions other than malignant neoplasm: Secondary | ICD-10-CM | POA: Insufficient documentation

## 2015-12-24 NOTE — Assessment & Plan Note (Signed)
Upper back sprain: Better after prednisone but not completely well, not taking a muscle relaxant, on retrospect, since November he is doing a lot more work at his computer and  thinks that is the etiology of his pain. For now recommend conservative treatment with prnTylenol-ibuprofen and call if not better in the next 2 or 3 months. HTN: Well-controlled History of SCC, sees dermatology regularly RTC 6 months, routine checkup.

## 2015-12-26 ENCOUNTER — Other Ambulatory Visit: Payer: BLUE CROSS/BLUE SHIELD

## 2015-12-28 ENCOUNTER — Other Ambulatory Visit: Payer: Self-pay | Admitting: Internal Medicine

## 2015-12-30 ENCOUNTER — Telehealth: Payer: Self-pay | Admitting: Internal Medicine

## 2015-12-30 DIAGNOSIS — D369 Benign neoplasm, unspecified site: Secondary | ICD-10-CM

## 2015-12-30 NOTE — Telephone Encounter (Signed)
EGD   12/16/2009  bx-- benign squamous papiloma, no Barrett's Colonoscopy 12-16-2009: One polyp, 2 mm. Path, Tubular adenoma Advise pt: needs to see Gi, please arrange referral , dx tubular adenoma

## 2015-12-31 ENCOUNTER — Encounter: Payer: Self-pay | Admitting: Internal Medicine

## 2015-12-31 NOTE — Telephone Encounter (Signed)
Letter printed and mailed to Pt, GI referral placed. Records abstracted and sent for scanning.

## 2016-01-01 ENCOUNTER — Other Ambulatory Visit (INDEPENDENT_AMBULATORY_CARE_PROVIDER_SITE_OTHER): Payer: BLUE CROSS/BLUE SHIELD

## 2016-01-01 DIAGNOSIS — I10 Essential (primary) hypertension: Secondary | ICD-10-CM

## 2016-01-01 DIAGNOSIS — Z0000 Encounter for general adult medical examination without abnormal findings: Secondary | ICD-10-CM

## 2016-01-01 DIAGNOSIS — Z114 Encounter for screening for human immunodeficiency virus [HIV]: Secondary | ICD-10-CM

## 2016-01-01 DIAGNOSIS — E785 Hyperlipidemia, unspecified: Secondary | ICD-10-CM | POA: Diagnosis not present

## 2016-01-01 LAB — CBC WITH DIFFERENTIAL/PLATELET
BASOS ABS: 0 10*3/uL (ref 0.0–0.1)
BASOS PCT: 0.6 % (ref 0.0–3.0)
EOS ABS: 0.1 10*3/uL (ref 0.0–0.7)
Eosinophils Relative: 1.2 % (ref 0.0–5.0)
HCT: 40.5 % (ref 39.0–52.0)
HEMOGLOBIN: 14.1 g/dL (ref 13.0–17.0)
LYMPHS PCT: 25 % (ref 12.0–46.0)
Lymphs Abs: 1.5 10*3/uL (ref 0.7–4.0)
MCHC: 35 g/dL (ref 30.0–36.0)
MCV: 97.1 fl (ref 78.0–100.0)
MONO ABS: 0.7 10*3/uL (ref 0.1–1.0)
Monocytes Relative: 11.6 % (ref 3.0–12.0)
NEUTROS ABS: 3.6 10*3/uL (ref 1.4–7.7)
Neutrophils Relative %: 61.6 % (ref 43.0–77.0)
PLATELETS: 185 10*3/uL (ref 150.0–400.0)
RBC: 4.17 Mil/uL — ABNORMAL LOW (ref 4.22–5.81)
RDW: 12.9 % (ref 11.5–15.5)
WBC: 5.9 10*3/uL (ref 4.0–10.5)

## 2016-01-01 LAB — HEMOGLOBIN A1C: HEMOGLOBIN A1C: 5.7 % (ref 4.6–6.5)

## 2016-01-01 LAB — LIPID PANEL
CHOL/HDL RATIO: 4
Cholesterol: 144 mg/dL (ref 0–200)
HDL: 34 mg/dL — AB (ref >39.00)
NonHDL: 110.25
Triglycerides: 268 mg/dL — ABNORMAL HIGH (ref 0.0–149.0)
VLDL: 53.6 mg/dL — AB (ref 0.0–40.0)

## 2016-01-01 LAB — COMPREHENSIVE METABOLIC PANEL
ALT: 34 U/L (ref 0–53)
AST: 25 U/L (ref 0–37)
Albumin: 4 g/dL (ref 3.5–5.2)
Alkaline Phosphatase: 52 U/L (ref 39–117)
BUN: 13 mg/dL (ref 6–23)
CHLORIDE: 100 meq/L (ref 96–112)
CO2: 29 meq/L (ref 19–32)
Calcium: 8.9 mg/dL (ref 8.4–10.5)
Creatinine, Ser: 0.98 mg/dL (ref 0.40–1.50)
GFR: 84.04 mL/min (ref >60.00)
GLUCOSE: 132 mg/dL — AB (ref 70–99)
POTASSIUM: 3.6 meq/L (ref 3.5–5.1)
SODIUM: 138 meq/L (ref 135–145)
Total Bilirubin: 0.8 mg/dL (ref 0.2–1.2)
Total Protein: 6.4 g/dL (ref 6.0–8.3)

## 2016-01-01 LAB — HIV ANTIBODY (ROUTINE TESTING W REFLEX): HIV: NONREACTIVE

## 2016-01-01 LAB — TSH: TSH: 1.19 u[IU]/mL (ref 0.35–4.50)

## 2016-01-01 LAB — LDL CHOLESTEROL, DIRECT: Direct LDL: 74 mg/dL

## 2016-01-20 LAB — HM COLONOSCOPY

## 2016-02-10 DIAGNOSIS — L814 Other melanin hyperpigmentation: Secondary | ICD-10-CM | POA: Diagnosis not present

## 2016-02-10 DIAGNOSIS — D1801 Hemangioma of skin and subcutaneous tissue: Secondary | ICD-10-CM | POA: Diagnosis not present

## 2016-02-10 DIAGNOSIS — L719 Rosacea, unspecified: Secondary | ICD-10-CM | POA: Diagnosis not present

## 2016-02-10 DIAGNOSIS — L570 Actinic keratosis: Secondary | ICD-10-CM | POA: Diagnosis not present

## 2016-03-26 ENCOUNTER — Other Ambulatory Visit: Payer: Self-pay | Admitting: Internal Medicine

## 2016-03-28 ENCOUNTER — Other Ambulatory Visit: Payer: Self-pay | Admitting: Internal Medicine

## 2016-06-22 ENCOUNTER — Encounter: Payer: Self-pay | Admitting: Internal Medicine

## 2016-06-22 ENCOUNTER — Ambulatory Visit (INDEPENDENT_AMBULATORY_CARE_PROVIDER_SITE_OTHER): Payer: BLUE CROSS/BLUE SHIELD | Admitting: Internal Medicine

## 2016-06-22 VITALS — BP 122/78 | HR 64 | Temp 98.2°F | Resp 12 | Ht 69.0 in | Wt 245.4 lb

## 2016-06-22 DIAGNOSIS — K588 Other irritable bowel syndrome: Secondary | ICD-10-CM

## 2016-06-22 DIAGNOSIS — I10 Essential (primary) hypertension: Secondary | ICD-10-CM

## 2016-06-22 DIAGNOSIS — F419 Anxiety disorder, unspecified: Secondary | ICD-10-CM

## 2016-06-22 LAB — BASIC METABOLIC PANEL
BUN: 13 mg/dL (ref 6–23)
CHLORIDE: 99 meq/L (ref 96–112)
CO2: 32 mEq/L (ref 19–32)
CREATININE: 1.14 mg/dL (ref 0.40–1.50)
Calcium: 9.5 mg/dL (ref 8.4–10.5)
GFR: 70.46 mL/min (ref >60.00)
Glucose, Bld: 126 mg/dL — ABNORMAL HIGH (ref 70–99)
POTASSIUM: 3.9 meq/L (ref 3.5–5.1)
Sodium: 138 mEq/L (ref 135–145)

## 2016-06-22 NOTE — Progress Notes (Signed)
Pre visit review using our clinic review tool, if applicable. No additional management support is needed unless otherwise documented below in the visit note. 

## 2016-06-22 NOTE — Patient Instructions (Signed)
GO TO THE LAB : Get the blood work     GO TO THE FRONT DESK Schedule your next appointment for a  Physical exam by 12-2016

## 2016-06-22 NOTE — Assessment & Plan Note (Addendum)
HTN: Continue carvedilol, Hyzaar, check a BMP. IBS-RUQ abdominal pain:  Issue has resurface, extensive workup years ago, no red flag sx today. Recommend observation for now Anxiety: Doing great, has not used Xanax ina while , pt request to remove it from his list. (Okay to take as needed in the future) Colon polyps: Had a colonoscopy recently, I can't see the actual report on [facility] RTC CPX 12-2016

## 2016-06-22 NOTE — Progress Notes (Signed)
Subjective:    Patient ID: George Hernandez, male    DOB: October 16, 1961, 55 y.o.   MRN: [id number]  DOS:  06/22/2016 Type of visit - description : Routine office visit Interval history: DM: Good medication compliance, he stopped taking his ambulatory CBGs because they were normal. GI: Had a colonoscopy Also again has pain in the right upper quadrant of the abdomen on and off.   Wt Readings from Last 3 Encounters:  06/22/16 245 lb 6 oz (111.3 kg)  12/23/15 256 lb (116.1 kg)  11/20/15 252 lb 4 oz (114.4 kg)     Review of Systems  Denies fever, chills. Occasional nausea but no vomiting, diarrhea or blood in the stools. No abdominal rash.  Past Medical History:  Diagnosis Date  . CAD (coronary artery disease)    Mild non-obstructive CAD per catrh  . Fatty liver    disease, dx in [place] early 2000  . GERD (gastroesophageal reflux disease)    Hiatal hernia  . Hyperlipemia   . Hypertension   . IBS (irritable bowel syndrome)    chronic upper abd pain, Dx in [place] in early 2000 (EGD and sigmoidoscopy)  . Obstructive sleep apnea   . Skin cancer    sees derm q 6 months   . Sleep apnea   . Tubular adenoma 2011   repeat cscope in 5 years, Dr. Rolm Bookbinder    Past Surgical History:  Procedure Laterality Date  . ESOPHAGOGASTRODUODENOSCOPY  2011   Dr. Rolm Bookbinder  . Right Groin SCC removed  2008   sees derm routinely per pt     Social History   Social History  . Marital status: Married    Spouse name: N/A  . Number of children: 1   . Years of education: N/A   Occupational History  . IT, Careers information officer    Social History Main Topics  . Smoking status: Former Research scientist (life sciences)  . Smokeless tobacco: Never Used     Comment: quit 2008  . Alcohol use 3.6 - 4.8 oz/week    6 - 8 Cans of beer per week  . Drug use: No  . Sexual activity: Not on file   Other Topics Concern  . Not on file   Social History Narrative   Household-- pt, wife and child        Medication List        Accurate as of 06/22/16  1:11 PM. Always use your most recent med list.          aspirin 81 MG tablet Take 81 mg by mouth daily.   atorvastatin 10 MG tablet Commonly known as:  LIPITOR Take 0.5 tablets (5 mg total) by mouth at bedtime.   carvedilol 12.5 MG tablet Commonly known as:  COREG Take 1 tablet (12.5 mg total) by mouth 2 (two) times daily with a meal.   FIBER PO Take 1 tablet by mouth daily.   losartan-hydrochlorothiazide 100-25 MG tablet Commonly known as:  HYZAAR Take 1 tablet by mouth daily.   multivitamin Tabs tablet Take 1 tablet by mouth daily.   omeprazole 20 MG tablet Commonly known as:  PRILOSEC OTC Take 20 mg by mouth daily. Reported on 11/20/2015          Objective:   Physical Exam BP 122/78 (BP Location: Left Arm, Patient Position: Sitting, Cuff Size: Normal)   Pulse 64   Temp 98.2 F (36.8 C) (Oral)   Resp 12   Ht 5\' 9"  (1.753 m)  Wt 245 lb 6 oz (111.3 kg)   SpO2 97%   BMI 36.24 kg/m  General:   Well developed, well nourished . NAD.  HEENT:  Normocephalic . Face symmetric, atraumatic Lungs:  CTA B Normal respiratory effort, no intercostal retractions, no accessory muscle use. Heart: RRR,  no murmur.  no pretibial edema bilaterally  Abdomen:  Not distended, soft, non-tender. No rebound or rigidity.  Skin: Not pale. Not jaundice Neurologic:  alert & oriented X3.  Speech normal, gait appropriate for age and unassisted Psych--  Cognition and judgment appear intact.  Cooperative with normal attention span and concentration.  Behavior appropriate. No anxious or depressed appearing.    Assessment & Plan:   Assessment Prediabetes HTN Hyperlipidemia Anxiety- xanax, low risk UDS 11-2015; self DC Xanax 06-2016 Nonobstructive CAD by cath Morbid  obesity (BMI 37  and pre-DM, HTN, hyperlipidemia) GI:  --GERD, HH --IBS: Chronic upper abdominal pain, DX in [place]~2,000, had a EGD/sigmoidoscopy --Fatty liver DX  2000 --Cscope 12-16-2009--  One 56mm polyp, tbular adenoma, Dr Rolm Bookbinder --Cscope again 01-20-16 @ [place] --EGD 2011 [facility], also a EUS EGD @ [facility], had a bx---benign findings , no barrett's OSA  SCC, right groin, removed 2008, sees dermatology Paresthesias, saw neurology 2013: Neuropathy? Meralgia paresthetica? Radiculopathy? Rx observation  PLAN  HTN: Continue carvedilol, Hyzaar, check a BMP. IBS-RUQ abdominal pain:  Issue has resurface, extensive workup years ago, no red flag sx today. Recommend observation for now Anxiety: Doing great, has not used Xanax ina while , pt request to remove it from his list. (Okay to take as needed in the future) Colon polyps: Had a colonoscopy recently, I can't see the actual report on [facility] RTC CPX 12-2016

## 2016-08-04 ENCOUNTER — Other Ambulatory Visit: Payer: Self-pay | Admitting: Internal Medicine

## 2016-10-13 ENCOUNTER — Telehealth: Payer: Self-pay | Admitting: Internal Medicine

## 2016-10-13 ENCOUNTER — Telehealth: Payer: Self-pay

## 2016-10-13 ENCOUNTER — Encounter (HOSPITAL_BASED_OUTPATIENT_CLINIC_OR_DEPARTMENT_OTHER): Payer: Self-pay

## 2016-10-13 ENCOUNTER — Emergency Department (HOSPITAL_BASED_OUTPATIENT_CLINIC_OR_DEPARTMENT_OTHER)
Admission: EM | Admit: 2016-10-13 | Discharge: 2016-10-13 | Disposition: A | Discharge status: Home / Self Care | Payer: BLUE CROSS/BLUE SHIELD | Attending: Emergency Medicine | Admitting: Emergency Medicine

## 2016-10-13 ENCOUNTER — Emergency Department (HOSPITAL_BASED_OUTPATIENT_CLINIC_OR_DEPARTMENT_OTHER): Payer: BLUE CROSS/BLUE SHIELD

## 2016-10-13 DIAGNOSIS — Z7982 Long term (current) use of aspirin: Secondary | ICD-10-CM | POA: Diagnosis not present

## 2016-10-13 DIAGNOSIS — K625 Hemorrhage of anus and rectum: Secondary | ICD-10-CM | POA: Diagnosis not present

## 2016-10-13 DIAGNOSIS — I10 Essential (primary) hypertension: Secondary | ICD-10-CM | POA: Insufficient documentation

## 2016-10-13 DIAGNOSIS — K429 Umbilical hernia without obstruction or gangrene: Secondary | ICD-10-CM | POA: Diagnosis not present

## 2016-10-13 DIAGNOSIS — K4090 Unilateral inguinal hernia, without obstruction or gangrene, not specified as recurrent: Secondary | ICD-10-CM | POA: Diagnosis not present

## 2016-10-13 DIAGNOSIS — R1030 Lower abdominal pain, unspecified: Secondary | ICD-10-CM | POA: Diagnosis not present

## 2016-10-13 DIAGNOSIS — Z87891 Personal history of nicotine dependence: Secondary | ICD-10-CM | POA: Diagnosis not present

## 2016-10-13 DIAGNOSIS — Z79899 Other long term (current) drug therapy: Secondary | ICD-10-CM | POA: Insufficient documentation

## 2016-10-13 DIAGNOSIS — Z85820 Personal history of malignant melanoma of skin: Secondary | ICD-10-CM | POA: Diagnosis not present

## 2016-10-13 DIAGNOSIS — I2510 Atherosclerotic heart disease of native coronary artery without angina pectoris: Secondary | ICD-10-CM | POA: Insufficient documentation

## 2016-10-13 LAB — CBC WITH DIFFERENTIAL/PLATELET
Basophils Absolute: 0 10*3/uL (ref 0.0–0.1)
Basophils Relative: 1 %
Eosinophils Absolute: 0.1 10*3/uL (ref 0.0–0.7)
Eosinophils Relative: 1 %
HCT: 37.7 % — ABNORMAL LOW (ref 39.0–52.0)
Hemoglobin: 13.6 g/dL (ref 13.0–17.0)
Lymphocytes Relative: 29 %
Lymphs Abs: 1.9 10*3/uL (ref 0.7–4.0)
MCH: 33.1 pg (ref 26.0–34.0)
MCHC: 36.1 g/dL — ABNORMAL HIGH (ref 30.0–36.0)
MCV: 91.7 fL (ref 78.0–100.0)
Monocytes Absolute: 0.9 10*3/uL (ref 0.1–1.0)
Monocytes Relative: 15 %
Neutro Abs: 3.5 10*3/uL (ref 1.7–7.7)
Neutrophils Relative %: 54 %
Platelets: 216 10*3/uL (ref 150–400)
RBC: 4.11 MIL/uL — ABNORMAL LOW (ref 4.22–5.81)
RDW: 12 % (ref 11.5–15.5)
WBC: 6.3 10*3/uL (ref 4.0–10.5)

## 2016-10-13 LAB — COMPREHENSIVE METABOLIC PANEL
ALK PHOS: 54 U/L (ref 38–126)
ALT: 28 U/L (ref 17–63)
AST: 22 U/L (ref 15–41)
Albumin: 3.9 g/dL (ref 3.5–5.0)
Anion gap: 8 (ref 5–15)
BUN: 14 mg/dL (ref 6–20)
CHLORIDE: 103 mmol/L (ref 101–111)
CO2: 28 mmol/L (ref 22–32)
CREATININE: 1.01 mg/dL (ref 0.61–1.24)
Calcium: 9.1 mg/dL (ref 8.9–10.3)
GFR calc Af Amer: >60 mL/min (ref >60)
GFR calc non Af Amer: >60 mL/min (ref >60)
GLUCOSE: 100 mg/dL — AB (ref 65–99)
Potassium: 3.7 mmol/L (ref 3.5–5.1)
SODIUM: 139 mmol/L (ref 135–145)
Total Bilirubin: 0.5 mg/dL (ref 0.3–1.2)
Total Protein: 6.5 g/dL (ref 6.5–8.1)

## 2016-10-13 LAB — LIPASE, BLOOD: Lipase: 38 U/L (ref 11–51)

## 2016-10-13 MED ORDER — SODIUM CHLORIDE 0.9 % IV SOLN
INTRAVENOUS | Status: DC
  Administered 2016-10-13: 17:00:00 via INTRAVENOUS

## 2016-10-13 MED ORDER — IOPAMIDOL (ISOVUE-300) INJECTION 61%
100.0000 mL | Freq: Once | INTRAVENOUS | Status: AC | PRN
  Administered 2016-10-13: 100 mL via INTRAVENOUS

## 2016-10-13 MED ORDER — ONDANSETRON HCL 4 MG/2ML IJ SOLN: 4.0000 mg | Freq: Once | INTRAMUSCULAR | Status: DC

## 2016-10-13 MED ORDER — SODIUM CHLORIDE 0.9 % IV SOLN: INTRAVENOUS | Status: DC

## 2016-10-13 MED ORDER — SODIUM CHLORIDE 0.9 % IV BOLUS (SEPSIS)
500.0000 mL | Freq: Once | INTRAVENOUS | Status: AC
  Administered 2016-10-13: 500 mL via INTRAVENOUS

## 2016-10-13 MED ORDER — SODIUM CHLORIDE 0.9 % IV BOLUS (SEPSIS): 250.0000 mL | Freq: Once | INTRAVENOUS | Status: DC

## 2016-10-13 NOTE — Telephone Encounter (Signed)
See in system that patient was in ED today.

## 2016-10-13 NOTE — Discharge Instructions (Signed)
Follow-up with your regular doctor. Return for any new or worse bleeding.

## 2016-10-13 NOTE — Telephone Encounter (Signed)
Per patient's chart, he went to the ER as advised by [facility] RN.

## 2016-10-13 NOTE — Telephone Encounter (Signed)
At ED now.

## 2016-10-13 NOTE — Telephone Encounter (Signed)
[facility] - Client [place]  Patient Name: George Hernandez  DOB: 02-Jun-1961    Initial Comment Caller states he is having severe abdominal pain and rectal bleeding that is spotty.    Nurse Assessment  Nurse: Wayne Sever, RN, Tillie Rung Date/Time (Eastern Time): 10/13/2016 12:06:55 PM  Confirm and document reason for call. If symptomatic, describe symptoms. ---Caller states he has a history of IBS. He states he got worse late last week. He is having abdominal pain that is intermittent he states it's on the lower right sided abdomen. He states it goes between a 3-10 on a pain scale. He has a history of hemorrhoids and after lunch he saw some blood clots while walking, it was separate from stooling.  Does the patient have any new or worsening symptoms? ---Yes  Will a triage be completed? ---Yes  Related visit to physician within the last 2 weeks? ---No  Does the PT have any chronic conditions? (i.e. diabetes, asthma, etc.) ---Yes  List chronic conditions. ---IBS, GERD, HTN  Is this a behavioral health or substance abuse call? ---No     Guidelines    Guideline Title Affirmed Question Affirmed Notes  Rectal Bleeding [1] MODERATE rectal bleeding (small blood clots, passing blood without stool, or toilet water turns red) AND [2] more than once a day    Final Disposition User   Go to ED Now Wayne Sever, RN, [facility] - ED   Disagree/Comply: Comply

## 2016-10-13 NOTE — ED Provider Notes (Signed)
[facility] Provider Note   CSN: [id number] Arrival date & time: 10/13/16  1233     History   Chief Complaint Chief Complaint  Patient presents with  . Abdominal Pain    HPI George Hernandez is a 55 y.o. male.  Patient presents with a 2-3 day history of some rectal bleeding. However blood is only on the toilet tissue. No blood in the commode. Today there was increased blood on the tissue. Patient has a history of IBS as well as internal hemorrhoids. Patient had colonoscopy year ago without any significant findings. Symptoms associated with some mild lower quadrant abdominal pain. No fevers no nausea or vomiting. No diarrhea. No back pain. Patient without any lightheadedness or dizziness. No syncope.      Past Medical History:  Diagnosis Date  . CAD (coronary artery disease)    Mild non-obstructive CAD per catrh  . Fatty liver    disease, dx in [place] early 2000  . GERD (gastroesophageal reflux disease)    Hiatal hernia  . Hyperlipemia   . Hypertension   . IBS (irritable bowel syndrome)    chronic upper abd pain, Dx in [place] in early 2000 (EGD and sigmoidoscopy)  . Obstructive sleep apnea   . Skin cancer    sees derm q 6 months   . Sleep apnea   . Tubular adenoma 2011   repeat cscope in 5 years, Dr. Rolm Bookbinder    Patient Active Problem List   Diagnosis Date Noted  . PCP NOTES >>>>>>>>>>>>>>>>>>>>>>>>>>>> 2020/03/816  . Prediabetes 10/16/2013  . Anxiety 11/10/2011  . CAD (coronary artery disease) 07/22/2011  . OSA (obstructive sleep apnea) 06/12/2011  . Annual physical exam 05/31/2011  . Fatigue 05/31/2011  . Meralgia paresthetica, paresthesias  05/31/2011  . ANGIOKERATOMA, BLEEDING 07/29/2009  . RUQ PAIN 10/21/2008  . HYPERLIPIDEMIA 09/27/2007  . CARCINOMA, SQUAMOUS CELL 07/20/2007  . HTN (hypertension) 05/17/2007  . GERD 05/17/2007  . HIATAL HERNIA 05/17/2007  . IRRITABLE BOWEL SYNDROME 05/17/2007  . FATTY LIVER DISEASE 05/17/2007     Past Surgical History:  Procedure Laterality Date  . ESOPHAGOGASTRODUODENOSCOPY  2011   Dr. Rolm Bookbinder  . Right Groin SCC removed  2008   sees derm routinely per pt        Home Medications    Prior to Admission medications   Medication Sig Start Date End Date Taking? Authorizing Provider  aspirin 81 MG tablet Take 81 mg by mouth daily.    Historical Provider, MD  atorvastatin (LIPITOR) 10 MG tablet Take 0.5 tablets (5 mg total) by mouth at bedtime. 03/29/16   Colon Branch, MD  carvedilol (COREG) 12.5 MG tablet Take 1 tablet (12.5 mg total) by mouth 2 (two) times daily with a meal. 03/26/16   Colon Branch, MD  FIBER PO Take 1 tablet by mouth daily.    Historical Provider, MD  losartan-hydrochlorothiazide (HYZAAR) 100-25 MG tablet Take 1 tablet by mouth daily. 08/04/16   Colon Branch, MD  multivitamin (ONE-A-DAY MEN'S) TABS Take 1 tablet by mouth daily.      Historical Provider, MD  omeprazole (PRILOSEC OTC) 20 MG tablet Take 20 mg by mouth daily. Reported on 11/20/2015    Historical Provider, MD    Family History Family History  Problem Relation Age of Onset  . Diabetes Mother   . Hypertension Mother   . Stroke Father 23  . CAD Other     GF  . Colon cancer Neg Hx   . Prostate  cancer Neg Hx     Social History Social History  Substance Use Topics  . Smoking status: Former Research scientist (life sciences)  . Smokeless tobacco: Never Used     Comment: quit 2008  . Alcohol use 3.6 - 4.8 oz/week    6 - 8 Cans of beer per week     Allergies   Bee venom   Review of Systems Review of Systems  Constitutional: Negative for fever.  HENT: Negative for congestion.   Eyes: Negative for redness.  Respiratory: Negative for shortness of breath.   Cardiovascular: Negative for chest pain.  Gastrointestinal: Positive for abdominal pain, anal bleeding and blood in stool. Negative for diarrhea, nausea, rectal pain and vomiting.  Genitourinary: Negative for dysuria.  Musculoskeletal: Negative for back pain.   Skin: Negative for rash.  Neurological: Negative for dizziness, syncope, light-headedness and headaches.  Hematological: Does not bruise/bleed easily.  Psychiatric/Behavioral: Negative for confusion.     Physical Exam Updated Vital Signs BP 141/92 (BP Location: Right Arm)   Pulse 73   Temp 98.1 F (36.7 C) (Oral)   Resp 20   Ht 5\' 9"  (1.753 m)   Wt 112.1 kg   SpO2 96%   BMI 36.48 kg/m   Physical Exam  Constitutional: He is oriented to person, place, and time. He appears well-developed and well-nourished. No distress.  HENT:  Head: Normocephalic and atraumatic.  Mouth/Throat: Oropharynx is clear and moist.  Eyes: Conjunctivae and EOM are normal. Pupils are equal, round, and reactive to light.  Neck: Normal range of motion. Neck supple.  Cardiovascular: Normal rate, regular rhythm and normal heart sounds.   Pulmonary/Chest: Effort normal and breath sounds normal. No respiratory distress.  Abdominal: Soft. Bowel sounds are normal. There is no tenderness.  Genitourinary: Rectum normal. Rectal exam shows guaiac negative stool.  Musculoskeletal: Normal range of motion.  Neurological: He is alert and oriented to person, place, and time. No cranial nerve deficit or sensory deficit. He exhibits normal muscle tone. Coordination normal.  Skin: Skin is warm. Capillary refill takes less than 2 seconds.  Vitals reviewed.    ED Treatments / Results  Labs (all labs ordered are listed, but only abnormal results are displayed) Labs Reviewed  CBC WITH DIFFERENTIAL/PLATELET - Abnormal; Notable for the following:       Result Value   RBC 4.11 (*)    HCT 37.7 (*)    MCHC 36.1 (*)    All other components within normal limits  COMPREHENSIVE METABOLIC PANEL - Abnormal; Notable for the following:    Glucose, Bld 100 (*)    All other components within normal limits  LIPASE, BLOOD    EKG  EKG Interpretation None       Radiology No results found.  Procedures Procedures  (including critical care time)  Medications Ordered in ED Medications  0.9 %  sodium chloride infusion (not administered)  sodium chloride 0.9 % bolus 500 mL (500 mLs Intravenous New Bag/Given 10/13/16 1347)     Initial Impression / Assessment and Plan / ED Course  I have reviewed the triage vital signs and the nursing notes.  Pertinent labs & imaging results that were available during my care of the patient were reviewed by me and considered in my medical decision making (see chart for details).  Clinical Course    Patient followed by Dr. Larose Kells. The patient with lower quadrant abdominal pain. Patients with 2-3 day history of some rectal bleeding. But it's only with wiping of the tissue. However recently  there was a large amount. No nausea no vomiting. Patient had a colonoscopy approximately one year ago. No other findings on that of significance other than internal hemorrhoids.  Patient's rectal exam here today is heme negative for nausea say no gross blood and not dark in color. No prolapsed hemorrhoids no external hemorrhoids. No mass on rectal exam. CT scan of the abdomen is pending if it is negative patient can be discharged home and follow-up with Dr. Larose Kells. Patient's hemoglobin and hematocrit is stable. Patient's vital signs are stable no hypotension no tachycardia. Patient is not pale.   Final Clinical Impressions(s) / ED Diagnoses   Final diagnoses:  Lower abdominal pain  Rectal bleeding    New Prescriptions New Prescriptions   No medications on file     Fredia Sorrow, MD 10/13/16 1502

## 2016-10-13 NOTE — ED Notes (Signed)
Patient is alert and oriented x3.  He was given DC instructions and follow up visit instructions.  Patient gave verbal understanding.  He was DC ambulatory under his own power to home.  V/S stable.  He was not showing any signs of distress on DC 

## 2016-10-13 NOTE — Telephone Encounter (Signed)
Patient called stating that he is having severe abdominal pain and rectal bleeding. Transferred to [facility]. Spoke with Judson Roch

## 2016-10-13 NOTE — ED Triage Notes (Signed)
C/o abd pain x 2-3 days-rectal bleeding started last Friday-pt with hx of same with IBS-but increase in rectal bleeding-NAD-steady gait

## 2016-10-14 NOTE — Telephone Encounter (Signed)
Called patient who states he went to ER. States he is still having some pain. Appointment scheduled with Dr Larose Kells. Advised patient that if his symptoms became worse  To go ER patient agreed

## 2016-10-14 NOTE — Telephone Encounter (Signed)
Patient called stating that he was returning your call. °

## 2016-10-26 ENCOUNTER — Encounter: Payer: Self-pay | Admitting: Internal Medicine

## 2016-10-26 ENCOUNTER — Ambulatory Visit (INDEPENDENT_AMBULATORY_CARE_PROVIDER_SITE_OTHER): Payer: BLUE CROSS/BLUE SHIELD | Admitting: Internal Medicine

## 2016-10-26 VITALS — BP 124/76 | HR 79 | Temp 98.1°F | Resp 14 | Ht 69.0 in | Wt 250.4 lb

## 2016-10-26 DIAGNOSIS — K625 Hemorrhage of anus and rectum: Secondary | ICD-10-CM | POA: Diagnosis not present

## 2016-10-26 DIAGNOSIS — R109 Unspecified abdominal pain: Secondary | ICD-10-CM | POA: Diagnosis not present

## 2016-10-26 LAB — URINALYSIS, ROUTINE W REFLEX MICROSCOPIC
Bilirubin Urine: NEGATIVE
Hgb urine dipstick: NEGATIVE
Ketones, ur: NEGATIVE
LEUKOCYTES UA: NEGATIVE
Nitrite: NEGATIVE
PH: 6 (ref 5.0–8.0)
RBC / HPF: NONE SEEN (ref 0–?)
Specific Gravity, Urine: 1.015 (ref 1.000–1.030)
TOTAL PROTEIN, URINE-UPE24: NEGATIVE
URINE GLUCOSE: NEGATIVE
Urobilinogen, UA: 0.2 (ref 0.0–1.0)
WBC, UA: NONE SEEN (ref 0–?)

## 2016-10-26 MED ORDER — HYDROCORTISONE ACETATE 25 MG RE SUPP: 25.0000 mg | Freq: Two times a day (BID) | RECTAL | 0 refills | Status: DC | PRN

## 2016-10-26 MED ORDER — DICYCLOMINE HCL 20 MG PO TABS: 20.0000 mg | ORAL_TABLET | Freq: Four times a day (QID) | ORAL | 0 refills | Status: AC | PRN

## 2016-10-26 NOTE — Assessment & Plan Note (Signed)
Red blood per rectum: As described above, he had a colonoscopy 01-2016 [place], reportedly normal, will get records (unable to see report on Care every where). He has no anemia, CT of the ER was negative. Etiology likely benign d/t hemorrhoids;  will rx suppositories and recommend observation Lower abdominal pain: Happening after what seem to be an acute gastroenteritis, he has a history of persistent RUQ due to IBS with (-) w/u in the past. Pain is atypical, post viral IBS?  For completeness we'll get a UA. Also Rx Bentyl as needed, call or RTC in 4 weeks if symptoms persist. Consider GI referral.

## 2016-10-26 NOTE — Progress Notes (Signed)
Subjective:    Patient ID: George Hernandez, male    DOB: 07-Apr-1961, 55 y.o.   MRN: [id number]  DOS:  10/26/2016 Type of visit - description : ER follow-up Interval history: The patient gives me the  following history: Around 10/02/2016 developed nausea, vomiting, diarrhea, stomach pain. Symptoms resolve except for a mild but persistent bilateral lower abdominal pain. Then he saw red-fresh blood per rectum 2 or 3 times before the ER visit. At least one time, RBPR was independent of BMs.  Due to abdominal pain and blood in the rectum he went to the ER  10/13/2016 , at the time the ER doctor reports that blood was only noted on the toilet paper (today pt states he saw blood w/o BMs).  Clinical exam @ ER: Abdomen was soft, rectal exam within normal with guaiac-negative stools. CBC, CMP, lipase were okay. CT abd (-)   Review of Systems Here today for a follow-up. He continue with bilateral lower abdomen discomfort, mostly associated with standing or certain postures. After the ER visit saw blood per rectum only one more time. Denies fever, chills, weight loss. No rectal pain or itching. Stools themselves are normal. No dysuria, gross hematuria difficulty urinating.   Past Medical History:  Diagnosis Date  . CAD (coronary artery disease)    Mild non-obstructive CAD per catrh  . Fatty liver    disease, dx in [place] early 2000  . GERD (gastroesophageal reflux disease)    Hiatal hernia  . Hyperlipemia   . Hypertension   . IBS (irritable bowel syndrome)    chronic upper abd pain, Dx in [place] in early 2000 (EGD and sigmoidoscopy)  . Obstructive sleep apnea   . Skin cancer    sees derm q 6 months   . Sleep apnea   . Tubular adenoma 2011   repeat cscope in 5 years, Dr. Rolm Bookbinder    Past Surgical History:  Procedure Laterality Date  . ESOPHAGOGASTRODUODENOSCOPY  2011   Dr. Rolm Bookbinder  . Right Groin SCC removed  2008   sees derm routinely per pt     Social History    Social History  . Marital status: Married    Spouse name: N/A  . Number of children: 1   . Years of education: N/A   Occupational History  . IT, Careers information officer    Social History Main Topics  . Smoking status: Former Research scientist (life sciences)  . Smokeless tobacco: Never Used     Comment: quit 2008  . Alcohol use 3.6 - 4.8 oz/week    6 - 8 Cans of beer per week  . Drug use: No  . Sexual activity: Not on file   Other Topics Concern  . Not on file   Social History Narrative   Household-- pt, wife and child      Allergies as of 10/26/2016      Reactions   Bee Venom Shortness Of Breath   Childhood reaction      Medication List       Accurate as of 10/26/16  6:59 PM. Always use your most recent med list.          aspirin 81 MG tablet Take 81 mg by mouth daily.   atorvastatin 10 MG tablet Commonly known as:  LIPITOR Take 0.5 tablets (5 mg total) by mouth at bedtime.   carvedilol 12.5 MG tablet Commonly known as:  COREG Take 1 tablet (12.5 mg total) by mouth 2 (two) times daily with a meal.  dicyclomine 20 MG tablet Commonly known as:  BENTYL Take 1 tablet (20 mg total) by mouth every 6 (six) hours as needed for spasms.   FIBER PO Take 1 tablet by mouth daily.   hydrocortisone 25 MG suppository Commonly known as:  ANUSOL-HC Place 1 suppository (25 mg total) rectally 2 (two) times daily as needed for hemorrhoids or itching.   losartan-hydrochlorothiazide 100-25 MG tablet Commonly known as:  HYZAAR Take 1 tablet by mouth daily.   multivitamin Tabs tablet Take 1 tablet by mouth daily.   omeprazole 20 MG tablet Commonly known as:  PRILOSEC OTC Take 20 mg by mouth daily. Reported on 11/20/2015          Objective:   Physical Exam BP 124/76 (BP Location: Left Arm, Patient Position: Sitting, Cuff Size: Normal)   Pulse 79   Temp 98.1 F (36.7 C) (Oral)   Resp 14   Ht 5\' 9"  (1.753 m)   Wt 250 lb 6 oz (113.6 kg)   SpO2 97%   BMI 36.97 kg/m  General:   Well  developed, well nourished . NAD.  HEENT:  Normocephalic . Face symmetric, atraumatic Lungs:  CTA B Normal respiratory effort, no intercostal retractions, no accessory muscle use. Heart: RRR,  no murmur.  no pretibial edema bilaterally  Abdomen:  Not distended, soft, minimal tenderness reported by patient w/ the lower abdomen exam. No mass, rebound. He has a small umbilical hernia. hernia.  DRE: No external hemorrhoids. Normal sphincter tone, prostate not enlarged, nontender. Small amount of brown stools Anoscopy: Few internal hemorrhoids. Skin: Not pale. Not jaundice Neurologic:  alert & oriented X3.  Speech normal, gait appropriate for age and unassisted Psych--  Cognition and judgment appear intact.  Cooperative with normal attention span and concentration.  Behavior appropriate. No anxious or depressed appearing.    Assessment & Plan:   Assessment Prediabetes HTN Hyperlipidemia Anxiety- xanax, low risk UDS 11-2015; self DC Xanax 06-2016 Nonobstructive CAD by cath Morbid  obesity (BMI 37  and pre-DM, HTN, hyperlipidemia) GI:  --GERD, HH --IBS: Chronic upper abdominal pain, DX in [place]~2,000, had a EGD/sigmoidoscopy --Fatty liver DX 2000 --Cscope 12-16-2009--  One 53mm polyp, tbular adenoma, Dr Rolm Bookbinder --Cscope again 01-20-16 @ [place] --EGD 2011 at [facility], also a EUS EGD @ [facility], had a bx---benign findings , no barrett's OSA  SCC, right groin, removed 2008, sees dermatology Paresthesias, saw neurology 2013: Neuropathy? Meralgia paresthetica? Radiculopathy? Rx observation  PLAN  Red blood per rectum: As described above, he had a colonoscopy 01-2016 [place], reportedly normal, will get records (unable to see report on Care every where). He has no anemia, CT of the ER was negative. Etiology likely benign d/t hemorrhoids;  will rx suppositories and recommend observation Lower abdominal pain: Happening after what seem to be an acute gastroenteritis, he has a history of persistent  RUQ due to IBS with (-) w/u in the past. Pain is atypical, post viral IBS?  For completeness we'll get a UA. Also Rx Bentyl as needed, call or RTC in 4 weeks if symptoms persist. Consider GI referral.  F2F>> 30 minutes

## 2016-10-26 NOTE — Progress Notes (Signed)
Pre visit review using our clinic review tool, if applicable. No additional management support is needed unless otherwise documented below in the visit note. 

## 2016-10-26 NOTE — Patient Instructions (Signed)
Use a suppository up to twice a day until better. For the next 2 days use one suppository every night.  Use Bentyl as needed for stomach pain  If you are not back to normal in 4 weeks or you have severe symptoms let me know.

## 2016-10-27 ENCOUNTER — Telehealth: Payer: Self-pay

## 2016-10-27 LAB — URINE CULTURE: Organism ID, Bacteria: NO GROWTH

## 2016-10-27 NOTE — Telephone Encounter (Signed)
Received cscope report from [facility]- Dr. Rolm Bookbinder dated 01/20/2016. Forwarded to PCP.

## 2016-10-28 NOTE — Telephone Encounter (Signed)
Cscope 01-20-16, Dr Synetta Fail [place], internal hemorrhoids, recheck in 5 years.  Report to be scan

## 2016-12-14 ENCOUNTER — Other Ambulatory Visit: Payer: Self-pay | Admitting: Internal Medicine

## 2016-12-23 ENCOUNTER — Ambulatory Visit (INDEPENDENT_AMBULATORY_CARE_PROVIDER_SITE_OTHER): Payer: BLUE CROSS/BLUE SHIELD | Admitting: Internal Medicine

## 2016-12-23 ENCOUNTER — Encounter: Payer: Self-pay | Admitting: Internal Medicine

## 2016-12-23 VITALS — BP 122/72 | HR 71 | Temp 98.1°F | Resp 14 | Ht 69.0 in | Wt 244.1 lb

## 2016-12-23 DIAGNOSIS — R5383 Other fatigue: Secondary | ICD-10-CM | POA: Diagnosis not present

## 2016-12-23 DIAGNOSIS — Z0000 Encounter for general adult medical examination without abnormal findings: Secondary | ICD-10-CM | POA: Diagnosis not present

## 2016-12-23 DIAGNOSIS — R1011 Right upper quadrant pain: Secondary | ICD-10-CM

## 2016-12-23 LAB — HEMOGLOBIN A1C: HEMOGLOBIN A1C: 5.6 % (ref 4.6–6.5)

## 2016-12-23 LAB — LIPID PANEL
CHOLESTEROL: 171 mg/dL (ref 0–200)
HDL: 40.5 mg/dL (ref >39.00)
NonHDL: 130.75
TRIGLYCERIDES: 400 mg/dL — AB (ref 0.0–149.0)
Total CHOL/HDL Ratio: 4
VLDL: 80 mg/dL — AB (ref 0.0–40.0)

## 2016-12-23 LAB — CBC WITH DIFFERENTIAL/PLATELET
BASOS PCT: 1 % (ref 0.0–3.0)
Basophils Absolute: 0.1 10*3/uL (ref 0.0–0.1)
EOS ABS: 0 10*3/uL (ref 0.0–0.7)
EOS PCT: 0.7 % (ref 0.0–5.0)
HCT: 43.5 % (ref 39.0–52.0)
HEMOGLOBIN: 15.3 g/dL (ref 13.0–17.0)
Lymphocytes Relative: 24.8 % (ref 12.0–46.0)
Lymphs Abs: 1.5 10*3/uL (ref 0.7–4.0)
MCHC: 35.1 g/dL (ref 30.0–36.0)
MCV: 95.6 fl (ref 78.0–100.0)
MONO ABS: 0.6 10*3/uL (ref 0.1–1.0)
Monocytes Relative: 10 % (ref 3.0–12.0)
NEUTROS ABS: 3.8 10*3/uL (ref 1.4–7.7)
Neutrophils Relative %: 63.5 % (ref 43.0–77.0)
PLATELETS: 188 10*3/uL (ref 150.0–400.0)
RBC: 4.55 Mil/uL (ref 4.22–5.81)
RDW: 13.5 % (ref 11.5–15.5)
WBC: 5.9 10*3/uL (ref 4.0–10.5)

## 2016-12-23 LAB — LDL CHOLESTEROL, DIRECT: Direct LDL: 85 mg/dL

## 2016-12-23 LAB — VITAMIN B12: VITAMIN B 12: 428 pg/mL (ref 211–911)

## 2016-12-23 LAB — FOLATE: Folate: >23.4 ng/mL (ref >5.9)

## 2016-12-23 NOTE — Assessment & Plan Note (Addendum)
Td 2008- declined booster ; zostavax discussed before;  Flu shot-- declined    Cscope 2011-- had bx, told they were ok,  Dr Rolm Bookbinder Cscope again 01-20-16 @ [place] , told ok    DRE neg 2015, PSA wnl-- reassess next year Lifestyle : Diet and exercise discussed   Labs: FLP, CBC, A1c, vitamin D, [id number], folic acid

## 2016-12-23 NOTE — Patient Instructions (Signed)
GO TO THE LAB : Get the blood work     GO TO THE FRONT DESK Schedule your next appointment for a  Routine check up in 6 months

## 2016-12-23 NOTE — Progress Notes (Signed)
Subjective:    Patient ID: George Hernandez, male    DOB: 1961/09/01, 56 y.o.   MRN: [id number]  DOS:  20-Mar-202018 Type of visit - description : CPX Interval history:  has some concerns, see below    Review of Systems Constitutional: No fever. No chills. No unexplained wt changes. No unusual sweats  HEENT: No dental problems, no ear discharge, no facial swelling, no voice changes. No eye discharge, no eye  redness , no  intolerance to light   Respiratory: No wheezing , no  difficulty breathing. No cough , no mucus production  Cardiovascular: No CP, no leg swelling , no  Palpitations  GI:  Continue with abdominal pain, at this point reports a pressure like pain at RLQ and sharp pain at the RUQ, pain is persistent 90% of the time, the RUQ is worse when he moves. Symptoms are not clearly postprandial. Still has occasional knee red blood per rectum.  no nausea, no vomiting, no diarrhea     Endocrine: No polyphagia, no polyuria , no polydipsia  GU: No dysuria, gross hematuria, difficulty urinating. No urinary urgency, no frequency.  Musculoskeletal: No joint swellings or unusual aches or pains  Skin: No change in the color of the skin, palor , no  Rash  Allergic, immunologic: No environmental allergies , no  food allergies  Neurological: No dizziness no  syncope. No headaches. No diplopia, no slurred, no slurred speech, no motor deficits, no facial  Numbness  Hematological: No enlarged lymph nodes, no easy bruising , no unusual bleedings  Psychiatry: No suicidal ideas, no hallucinations, no beavior problems, no confusion.  ++ stress at work  Past Medical History:  Diagnosis Date  . CAD (coronary artery disease)    Mild non-obstructive CAD per catrh  . Fatty liver    disease, dx in [place] early 2000  . GERD (gastroesophageal reflux disease)    Hiatal hernia  . Hyperlipemia   . Hypertension   . IBS (irritable bowel syndrome)    chronic upper abd pain, Dx in  [place] in early 2000 (EGD and sigmoidoscopy)  . Obstructive sleep apnea   . Skin cancer    sees derm q 6 months   . Sleep apnea   . Tubular adenoma 2011   repeat cscope in 5 years, Dr. Rolm Bookbinder    Past Surgical History:  Procedure Laterality Date  . ESOPHAGOGASTRODUODENOSCOPY  2011   Dr. Rolm Bookbinder  . Right Groin SCC removed  2008   sees derm routinely per pt     Social History   Social History  . Marital status: Married    Spouse name: N/A  . Number of children: 1   . Years of education: N/A   Occupational History  . IT, Careers information officer    Social History Main Topics  . Smoking status: Former Research scientist (life sciences)  . Smokeless tobacco: Never Used     Comment: quit 2008  . Alcohol use 3.6 - 4.8 oz/week    6 - 8 Cans of beer per week  . Drug use: No  . Sexual activity: Not on file   Other Topics Concern  . Not on file   Social History Narrative   Household-- pt, wife    Daughter 1999 in college [place]     Family History  Problem Relation Age of Onset  . Diabetes Mother   . Hypertension Mother   . Stroke Father 76  . CAD Other     GF  . Colon cancer  Neg Hx   . Prostate cancer Neg Hx      Allergies as of 02/03/2017      Reactions   Bee Venom Shortness Of Breath   Childhood reaction      Medication List       Accurate as of 12/23/16 11:59 PM. Always use your most recent med list.          aspirin 81 MG tablet Take 81 mg by mouth daily.   atorvastatin 10 MG tablet Commonly known as:  LIPITOR Take 0.5 tablets (5 mg total) by mouth at bedtime.   carvedilol 12.5 MG tablet Commonly known as:  COREG Take 1 tablet (12.5 mg total) by mouth 2 (two) times daily with a meal.   dicyclomine 20 MG tablet Commonly known as:  BENTYL Take 1 tablet (20 mg total) by mouth every 6 (six) hours as needed for spasms.   FIBER PO Take 1 tablet by mouth daily.   losartan-hydrochlorothiazide 100-25 MG tablet Commonly known as:  HYZAAR Take 1 tablet by mouth daily.     multivitamin Tabs tablet Take 1 tablet by mouth daily.   omeprazole 20 MG tablet Commonly known as:  PRILOSEC OTC Take 20 mg by mouth daily. Reported on 11/20/2015          Objective:   Physical Exam BP 122/72 (BP Location: Left Arm, Patient Position: Sitting, Cuff Size: Normal)   Pulse 71   Temp 98.1 F (36.7 C) (Oral)   Resp 14   Ht 5\' 9"  (1.753 m)   Wt 244 lb 2 oz (110.7 kg)   SpO2 98%   BMI 36.05 kg/m   General:   Well developed, well nourished . NAD.  Neck: No  thyromegaly  HEENT:  Normocephalic . Face symmetric, atraumatic Lungs:  CTA B Normal respiratory effort, no intercostal retractions, no accessory muscle use. Heart: RRR,  no murmur.  No pretibial edema bilaterally  Abdomen:  Not distended, soft, non-tender. No rebound or rigidity.   Skin: Exposed areas without rash. Not pale. Not jaundice Neurologic:  alert & oriented X3.  Speech normal, gait appropriate for age and unassisted Strength symmetric and appropriate for age.  Psych: Cognition and judgment appear intact.  Cooperative with normal attention span and concentration.  Behavior appropriate. No anxious or depressed appearing.       Assessment & Plan:   Assessment Prediabetes HTN Hyperlipidemia Anxiety- xanax, low risk UDS 11-2015; self DC Xanax 06-2016,  Nonobstructive CAD by cath Morbid  obesity (BMI 37  and pre-DM, HTN, hyperlipidemia) GI:  --GERD, HH --IBS: Chronic upper abdominal pain, DX in [place]~2,000, had a EGD/sigmoidoscopy --Fatty liver DX 2000 --Cscope 12-16-2009--  One 30mm polyp, tbular adenoma, Dr Rolm Bookbinder --Cscope again 01-20-16 @ [place] --EGD 2011 at [facility], also a EUS EGD @ [facility], had a bx---benign findings , no barrett's --abd pain, ER 10-2016 (-) CT OSA, mild per sleep study 2012 - no CPAP SCC, right groin, removed 2008, sees dermatology Paresthesias, saw neurology 2013: Neuropathy? Meralgia paresthetica? Radiculopathy? Rx observation  PLAN Prediabetes: Check A1c HTN:  Seems control on Hyzaar, Coreg. Last BMP satisfactory Hyperlipidemia: On Lipitor, check a FLP Anxiety: Under a lot of stress at work, currently not taking Xanax. Abdominal pain: Ongoing, suspect IBS, he is under a lot of stress. Similar sxs in 2000 with (-) workup. CT, UA 10/2016 negative. Refer to GI Reports fatigue: History of mild OSA, not on CPAP, will check labs today and reassess fatigue when he comes back, re-do sleep  study? OSA: See above RTC 6 months

## 2016-12-23 NOTE — Progress Notes (Signed)
Pre visit review using our clinic review tool, if applicable. No additional management support is needed unless otherwise documented below in the visit note. 

## 2016-12-24 NOTE — Assessment & Plan Note (Signed)
Prediabetes: Check A1c HTN: Seems control on Hyzaar, Coreg. Last BMP satisfactory Hyperlipidemia: On Lipitor, check a FLP Anxiety: Under a lot of stress at work, currently not taking Xanax. Abdominal pain: Ongoing, suspect IBS, he is under a lot of stress. Similar sxs in 2000 with (-) workup. CT, UA 10/2016 negative. Refer to GI Reports fatigue: History of mild OSA, not on CPAP, will check labs today and reassess fatigue when he comes back, re-do sleep study? OSA: See above RTC 6 months

## 2016-12-26 LAB — VITAMIN D 1,25 DIHYDROXY
VITAMIN D 1, 25 (OH) TOTAL: 45 pg/mL (ref 18–72)
VITAMIN D3 1, 25 (OH): 45 pg/mL

## 2016-12-27 ENCOUNTER — Other Ambulatory Visit: Payer: Self-pay | Admitting: Internal Medicine

## 2017-01-14 DIAGNOSIS — K760 Fatty (change of) liver, not elsewhere classified: Secondary | ICD-10-CM | POA: Diagnosis not present

## 2017-01-14 DIAGNOSIS — Z86010 Personal history of colonic polyps: Secondary | ICD-10-CM | POA: Diagnosis not present

## 2017-01-14 DIAGNOSIS — R1011 Right upper quadrant pain: Secondary | ICD-10-CM | POA: Diagnosis not present

## 2017-01-14 DIAGNOSIS — K210 Gastro-esophageal reflux disease with esophagitis: Secondary | ICD-10-CM | POA: Diagnosis not present

## 2017-02-01 DIAGNOSIS — R1011 Right upper quadrant pain: Secondary | ICD-10-CM | POA: Diagnosis not present

## 2017-02-01 DIAGNOSIS — K760 Fatty (change of) liver, not elsewhere classified: Secondary | ICD-10-CM | POA: Diagnosis not present

## 2017-02-08 ENCOUNTER — Other Ambulatory Visit: Payer: Self-pay | Admitting: Internal Medicine

## 2017-02-08 DIAGNOSIS — D1801 Hemangioma of skin and subcutaneous tissue: Secondary | ICD-10-CM | POA: Diagnosis not present

## 2017-02-08 DIAGNOSIS — D235 Other benign neoplasm of skin of trunk: Secondary | ICD-10-CM | POA: Diagnosis not present

## 2017-02-08 DIAGNOSIS — L820 Inflamed seborrheic keratosis: Secondary | ICD-10-CM | POA: Diagnosis not present

## 2017-02-08 DIAGNOSIS — L821 Other seborrheic keratosis: Secondary | ICD-10-CM | POA: Diagnosis not present

## 2017-02-08 DIAGNOSIS — L570 Actinic keratosis: Secondary | ICD-10-CM | POA: Diagnosis not present

## 2017-02-08 DIAGNOSIS — L814 Other melanin hyperpigmentation: Secondary | ICD-10-CM | POA: Diagnosis not present

## 2017-03-11 DIAGNOSIS — R1310 Dysphagia, unspecified: Secondary | ICD-10-CM | POA: Diagnosis not present

## 2017-03-11 DIAGNOSIS — K319 Disease of stomach and duodenum, unspecified: Secondary | ICD-10-CM | POA: Diagnosis not present

## 2017-03-11 DIAGNOSIS — R1011 Right upper quadrant pain: Secondary | ICD-10-CM | POA: Diagnosis not present

## 2017-03-11 DIAGNOSIS — R1314 Dysphagia, pharyngoesophageal phase: Secondary | ICD-10-CM | POA: Diagnosis not present

## 2017-05-20 ENCOUNTER — Other Ambulatory Visit: Payer: Self-pay

## 2017-05-20 MED ORDER — LOSARTAN POTASSIUM-HCTZ 100-25 MG PO TABS: 1.0000 | ORAL_TABLET | Freq: Every day | ORAL | 1 refills | Status: DC

## 2017-06-24 ENCOUNTER — Ambulatory Visit: Payer: BLUE CROSS/BLUE SHIELD | Admitting: Internal Medicine

## 2017-07-15 ENCOUNTER — Ambulatory Visit (INDEPENDENT_AMBULATORY_CARE_PROVIDER_SITE_OTHER): Payer: BLUE CROSS/BLUE SHIELD | Admitting: Internal Medicine

## 2017-07-15 ENCOUNTER — Encounter: Payer: Self-pay | Admitting: Internal Medicine

## 2017-07-15 VITALS — BP 134/76 | HR 70 | Temp 98.6°F | Resp 14 | Ht 69.0 in | Wt 247.5 lb

## 2017-07-15 DIAGNOSIS — I10 Essential (primary) hypertension: Secondary | ICD-10-CM

## 2017-07-15 DIAGNOSIS — E782 Mixed hyperlipidemia: Secondary | ICD-10-CM

## 2017-07-15 DIAGNOSIS — R5383 Other fatigue: Secondary | ICD-10-CM

## 2017-07-15 LAB — LIPID PANEL
CHOLESTEROL: 168 mg/dL (ref 0–200)
HDL: 41.9 mg/dL (ref >39.00)
LDL CALC: 92 mg/dL (ref 0–99)
NonHDL: 125.73
Total CHOL/HDL Ratio: 4
Triglycerides: 168 mg/dL — ABNORMAL HIGH (ref 0.0–149.0)
VLDL: 33.6 mg/dL (ref 0.0–40.0)

## 2017-07-15 LAB — COMPREHENSIVE METABOLIC PANEL
ALBUMIN: 4.4 g/dL (ref 3.5–5.2)
ALT: 48 U/L (ref 0–53)
AST: 34 U/L (ref 0–37)
Alkaline Phosphatase: 56 U/L (ref 39–117)
BUN: 14 mg/dL (ref 6–23)
CALCIUM: 9.5 mg/dL (ref 8.4–10.5)
CHLORIDE: 99 meq/L (ref 96–112)
CO2: 33 mEq/L — ABNORMAL HIGH (ref 19–32)
Creatinine, Ser: 1.02 mg/dL (ref 0.40–1.50)
GFR: 79.81 mL/min (ref >60.00)
Glucose, Bld: 125 mg/dL — ABNORMAL HIGH (ref 70–99)
POTASSIUM: 4.3 meq/L (ref 3.5–5.1)
SODIUM: 141 meq/L (ref 135–145)
Total Bilirubin: 0.8 mg/dL (ref 0.2–1.2)
Total Protein: 7 g/dL (ref 6.0–8.3)

## 2017-07-15 NOTE — Progress Notes (Signed)
Pre visit review using our clinic review tool, if applicable. No additional management support is needed unless otherwise documented below in the visit note. 

## 2017-07-15 NOTE — Patient Instructions (Signed)
GO TO THE LAB : Get the blood work     GO TO THE FRONT DESK  Schedule your next appointment for a  physical exam in 5 months, fasting  Stretch your upper back twice a day

## 2017-07-15 NOTE — Progress Notes (Signed)
Subjective:    Patient ID: George Hernandez, male    DOB: August 29, 1961, 56 y.o.   MRN: [id number]  DOS:  07/15/2017 Type of visit - description : rov Interval history: GI symptoms: Since the last visit, saw GI, workup was done. Symptoms are about the same HTN: Good compliance of medication no apparent side effects. Ambulatory BPs in the 130s/80s. DBP in the low 90s. He complained of fatigue the last visit, symptoms resolved. Also, has a right upper back burning pain on and off, this is going on for a good while. Denies neck pain or paresthesias. MRI?   Review of Systems   Past Medical History:  Diagnosis Date  . CAD (coronary artery disease)    Mild non-obstructive CAD per catrh  . Fatty liver    disease, dx in [place] early 2000  . GERD (gastroesophageal reflux disease)    Hiatal hernia  . Hyperlipemia   . Hypertension   . IBS (irritable bowel syndrome)    chronic upper abd pain, Dx in [place] in early 2000 (EGD and sigmoidoscopy)  . Obstructive sleep apnea   . Skin cancer    sees derm q 6 months   . Sleep apnea   . Tubular adenoma 2011   repeat cscope in 5 years, Dr. Rolm Bookbinder    Past Surgical History:  Procedure Laterality Date  . ESOPHAGOGASTRODUODENOSCOPY  2011   Dr. Rolm Bookbinder  . Right Groin SCC removed  2008   sees derm routinely per pt     Social History   Social History  . Marital status: Married    Spouse name: N/A  . Number of children: 1   . Years of education: N/A   Occupational History  . IT, Careers information officer    Social History Main Topics  . Smoking status: Former Research scientist (life sciences)  . Smokeless tobacco: Never Used     Comment: quit 2008  . Alcohol use 3.6 - 4.8 oz/week    6 - 8 Cans of beer per week  . Drug use: No  . Sexual activity: Not on file   Other Topics Concern  . Not on file   Social History Narrative   Household-- pt, wife    Daughter 1999 in [place] as of 07/15/2017      Reactions   Bee Venom Shortness  Of Breath   Childhood reaction      Medication List       Accurate as of 07/15/17 11:59 PM. Always use your most recent med list.          aspirin 81 MG tablet Take 81 mg by mouth daily.   atorvastatin 10 MG tablet Commonly known as:  LIPITOR Take 0.5 tablets (5 mg total) by mouth at bedtime.   carvedilol 12.5 MG tablet Commonly known as:  COREG Take 1 tablet (12.5 mg total) by mouth 2 (two) times daily with a meal.   dicyclomine 20 MG tablet Commonly known as:  BENTYL Take 1 tablet (20 mg total) by mouth every 6 (six) hours as needed for spasms.   FIBER PO Take 1 tablet by mouth daily.   losartan-hydrochlorothiazide 100-25 MG tablet Commonly known as:  HYZAAR Take 1 tablet by mouth daily.   multivitamin Tabs tablet Take 1 tablet by mouth daily.   omeprazole 20 MG tablet Commonly known as:  PRILOSEC OTC Take 20 mg by mouth daily. Reported on 11/20/2015  Discharge Care Instructions        Start     Ordered   07/15/17 0000  Comp Met (CMET)     07/15/17 0923   07/15/17 0000  Lipid panel     07/15/17 0923         Objective:   Physical Exam BP 134/76 (BP Location: Left Arm, Patient Position: Sitting, Cuff Size: Normal)   Pulse 70   Temp 98.6 F (37 C) (Oral)   Resp 14   Ht '5\' 9"'$  (1.753 m)   Wt 247 lb 8 oz (112.3 kg)   SpO2 98%   BMI 36.55 kg/m  General:   Well developed, well nourished . NAD.  HEENT:  Normocephalic . Face symmetric, atraumatic Neck: No TTP. Full range of motion. MSK: Shoulder mobility normal Lungs:  CTA B Normal respiratory effort, no intercostal retractions, no accessory muscle use. Heart: RRR,  no murmur.  No pretibial edema bilaterally  Skin: Not pale. Not jaundice Neurologic:  alert & oriented X3.  Speech normal, gait appropriate for age and unassisted. Motor symmetric Psych--  Cognition and judgment appear intact.  Cooperative with normal attention span and concentration.  Behavior appropriate. No  anxious or depressed appearing.      Assessment & Plan:   Assessment Prediabetes HTN Hyperlipidemia Anxiety- xanax, low risk UDS 11-2015; self DC Xanax 06-2016,  Nonobstructive CAD by cath Morbid  obesity (BMI 37  and pre-DM, HTN, hyperlipidemia) GI:  --GERD, HH --IBS: Chronic upper abdominal pain, DX in [place]~2,000, had a EGD/sigmoidoscopy --Fatty liver DX 2000 --Cscope 12-16-2009--  One 75m polyp, tbular adenoma, Dr BRolm Bookbinder--[place]16 [place]--[facility] a [facility] EGD @ [facility], had a bx---benign findings , no barrett's --abd pain, ER 10-2016 (-) CT OSA, mild per sleep study 2012 - no CPAP SCC, right groin, removed 2008, sees dermatology Paresthesias, saw neurology 2013: Neuropathy? Meralgia paresthetica? Radiculopathy? Rx observation  PLAN HTN: Continue Hyzaar, carvedilol. Check a CMP Hyperlipidemia: Triglycerides in the 400s, we agreed to recheck a FLP. If TG extremity high he would be willing to take fenofibrates, if is only moderately elevated she will try to improve lifestyle. Right upper back pain: 3 stretching modalities discussed. Encouraged to do that BID, call if not better for a referral Abdominal pain: Saw GI, had a EGD reportedly normal, abd [place](-), sx ~ the same, to see GI for a f/u next week Fatigue: see last visit, sxs resolved RTC 12-2015, CPX

## 2017-07-16 NOTE — Assessment & Plan Note (Signed)
HTN: Continue Hyzaar, carvedilol. Check a CMP Hyperlipidemia: Triglycerides in the 400s, we agreed to recheck a FLP. If TG extremity high he would be willing to take fenofibrates, if is only moderately elevated she will try to improve lifestyle. Right upper back pain: 3 stretching modalities discussed. Encouraged to do that BID, call if not better for a referral Abdominal pain: Saw GI, had a EGD reportedly normal, abd [place] (-), sx ~ the same, to see GI for a f/u next week Fatigue: see last visit, sxs resolved RTC 12-2015, CPX

## 2017-07-19 ENCOUNTER — Other Ambulatory Visit: Payer: Self-pay | Admitting: Internal Medicine

## 2017-07-26 DIAGNOSIS — K219 Gastro-esophageal reflux disease without esophagitis: Secondary | ICD-10-CM | POA: Diagnosis not present

## 2017-07-26 DIAGNOSIS — R1011 Right upper quadrant pain: Secondary | ICD-10-CM | POA: Diagnosis not present

## 2017-09-12 ENCOUNTER — Other Ambulatory Visit: Payer: Self-pay | Admitting: Internal Medicine

## 2017-09-20 DIAGNOSIS — L820 Inflamed seborrheic keratosis: Secondary | ICD-10-CM | POA: Diagnosis not present

## 2017-09-20 DIAGNOSIS — L718 Other rosacea: Secondary | ICD-10-CM | POA: Diagnosis not present

## 2017-09-20 DIAGNOSIS — D225 Melanocytic nevi of trunk: Secondary | ICD-10-CM | POA: Diagnosis not present

## 2017-09-20 DIAGNOSIS — L814 Other melanin hyperpigmentation: Secondary | ICD-10-CM | POA: Diagnosis not present

## 2017-10-26 IMAGING — CT CT ABD-PELV W/ CM
2 of 5 series · 16 of 46 positions shown, 18 images · IV contrast (APPLIED)
Comparison: 09/17/2016

CLINICAL DATA: Right abdominal pain, history of IBS/GERD, rectal
bleeding

EXAM:
CT ABDOMEN AND PELVIS WITH CONTRAST
TECHNIQUE: Multidetector CT imaging of the abdomen and pelvis was performed
using the standard protocol following bolus administration of
intravenous contrast.
CONTRAST:  100mL MZYGBL-2JJ IOPAMIDOL (MZYGBL-2JJ) INJECTION 61%

[Series 2: axial st · axial · 0.98mm/px · z∈[-539,-89]mm · 13 of 102 slices shown, 15 images]
[im 6/102  soft-tissue]
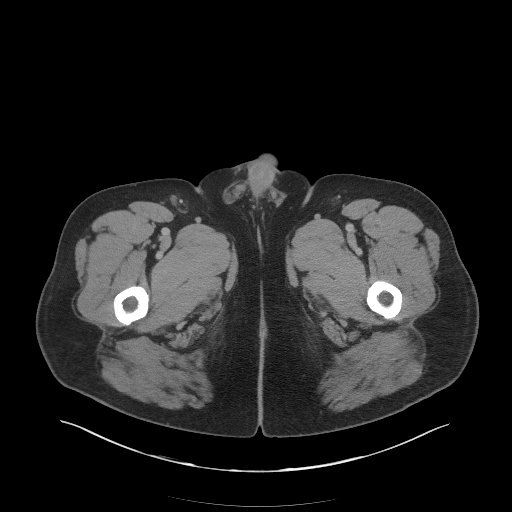
[im 6/102  bone]
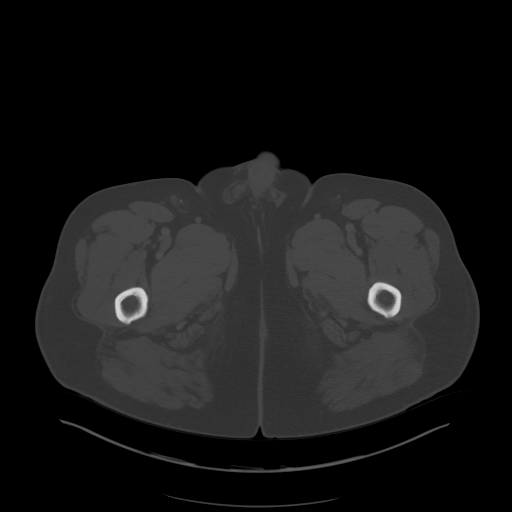
[im 12/102  soft-tissue]
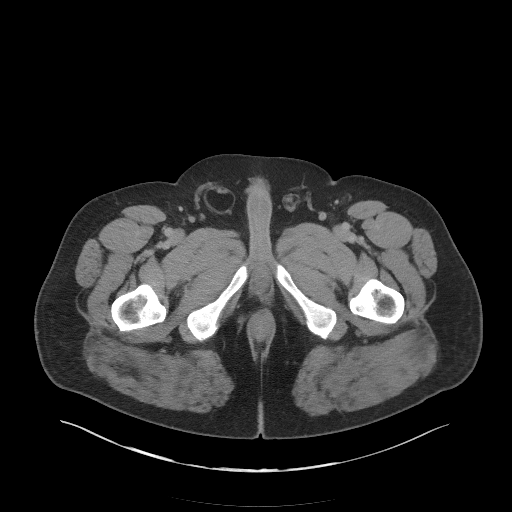
[im 23/102  soft-tissue]
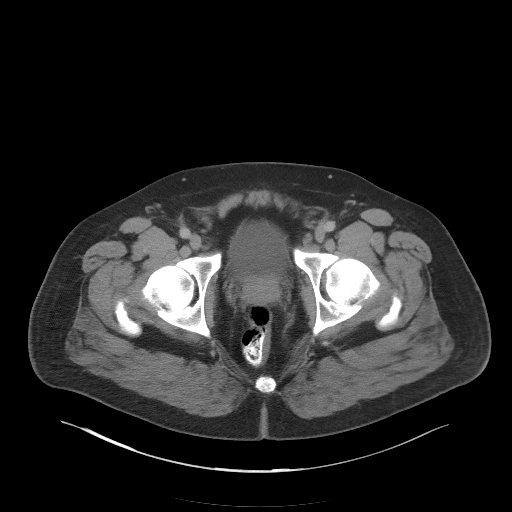
[im 29/102  soft-tissue]
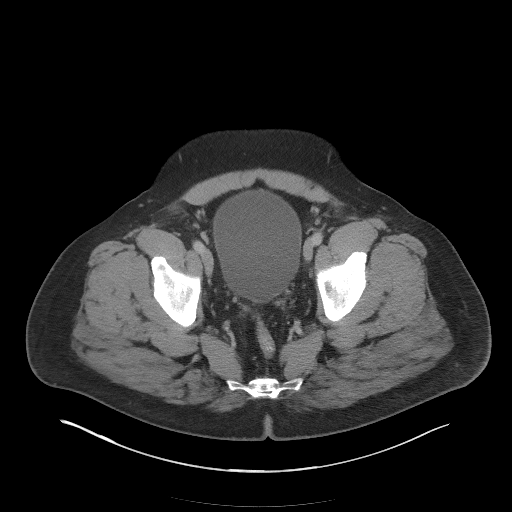
[im 34/102  soft-tissue]
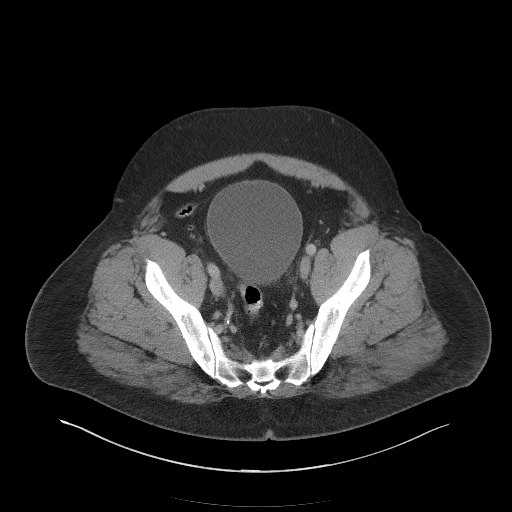
[im 45/102  soft-tissue]
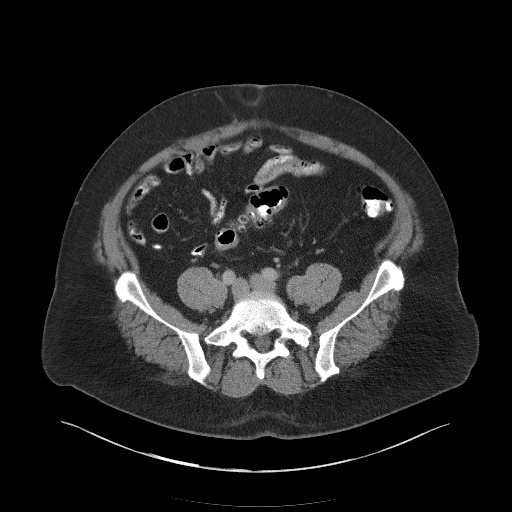
[im 51/102  soft-tissue]
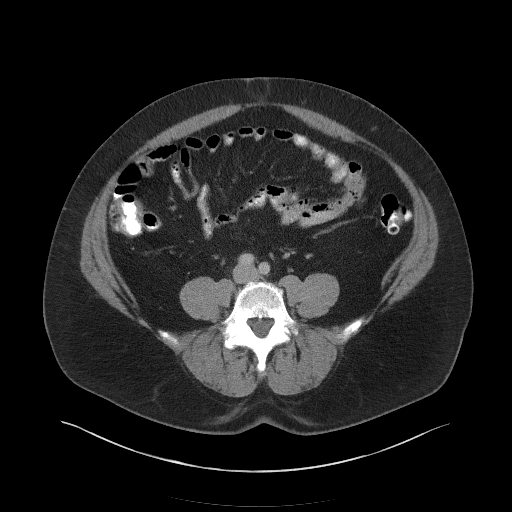
[im 57/102  soft-tissue]
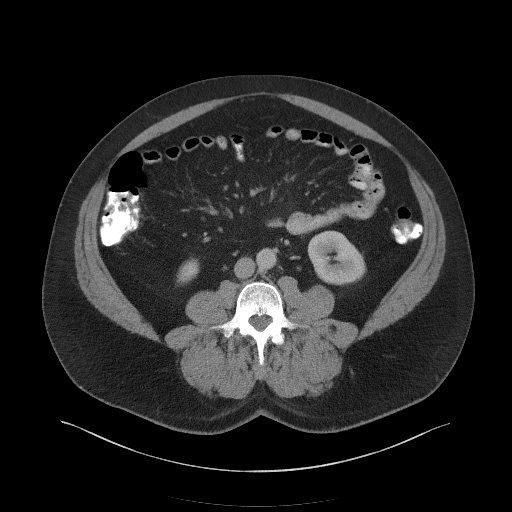
[im 68/102  soft-tissue]
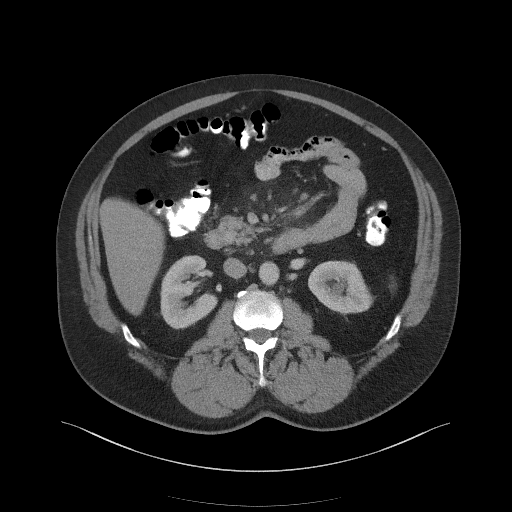
[im 68/102  bone]
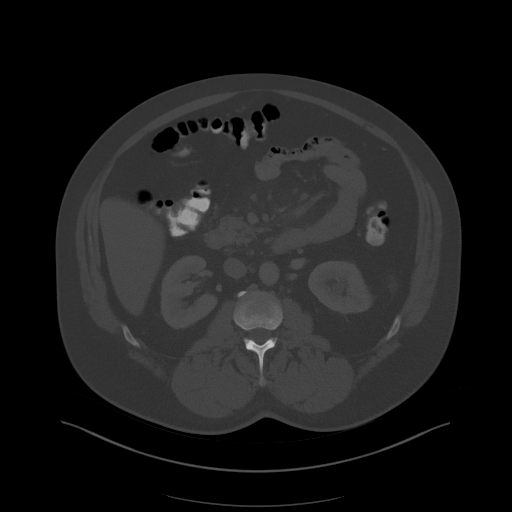
[im 73/102  soft-tissue]
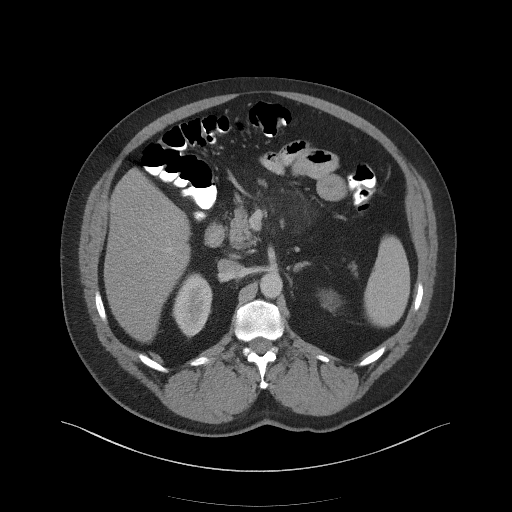
[im 79/102  soft-tissue]
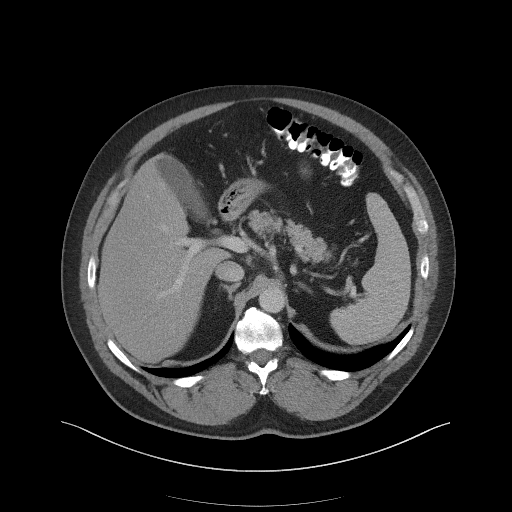
[im 90/102  soft-tissue]
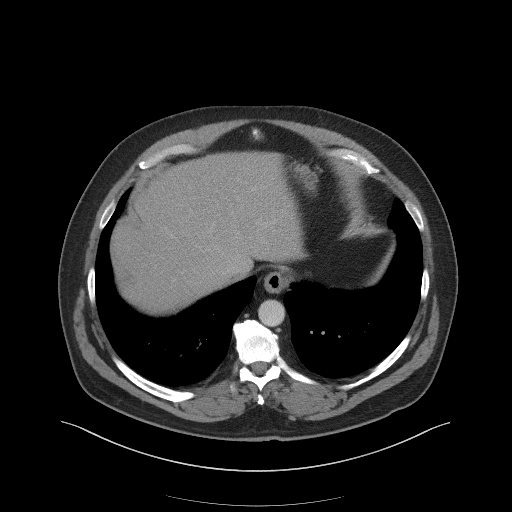
[im 96/102  soft-tissue]
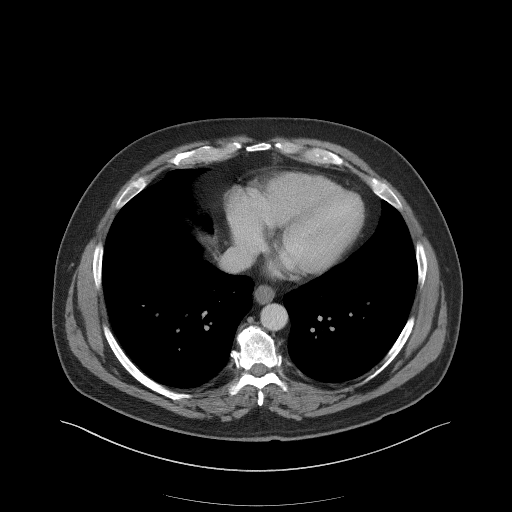

[Series 5: coronal st · coronal · 0.87mm/px · 3 of 128 slices shown]
[im 43/128  soft-tissue]
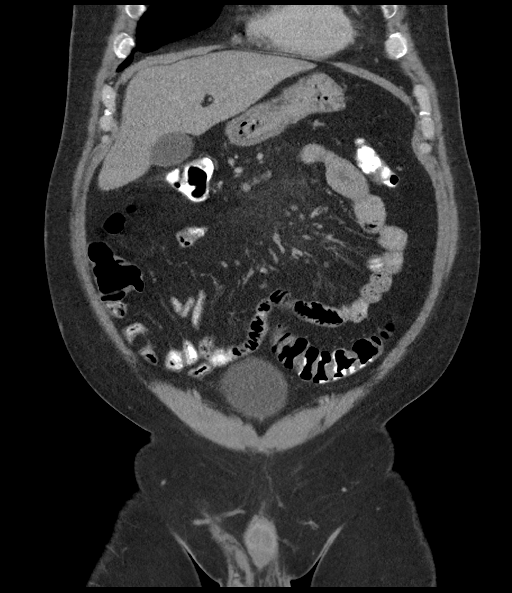
[im 57/128  soft-tissue]
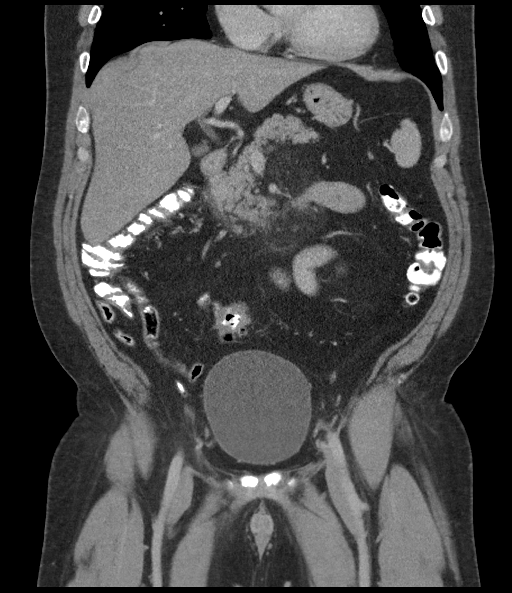
[im 71/128  soft-tissue]
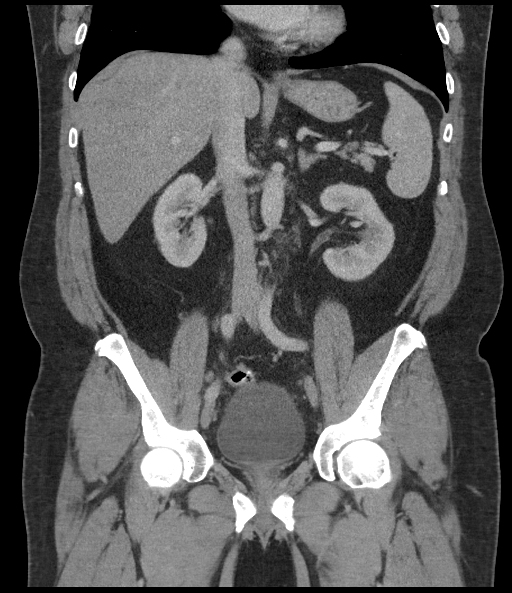

[16 of 46 positions shown; findings below may reference images not displayed]

FINDINGS: Lower chest: Lung bases are clear.

Hepatobiliary: Liver is within normal limits.

Gallbladder is unremarkable. No intrahepatic or extrahepatic ductal
dilatation.

Pancreas: Within normal limits.

Spleen: Within normal limits.

Adrenals/Urinary Tract: Adrenal glands are within normal limits.

Kidneys are within normal limits.  No hydronephrosis.

Bladder is within normal limits.

Stomach/Bowel: Stomach is notable for a tiny hiatal hernia.

No evidence of bowel obstruction.

Normal appendix (series 2/image 60).

Vascular/Lymphatic: No evidence of abdominal aortic aneurysm.

Atherosclerotic calcifications of the abdominal aorta and branch
vessels.

No suspicious abdominopelvic lymphadenopathy.

Lymphatic stranding along the jejunal mesentery with associated
small jejunal lymph nodes measuring up to 7 mm short axis (series
2/image 38), previously 6 mm in 3115, nonspecific but likely benign.

Reproductive: Prostate is unremarkable.

Other: No abdominopelvic ascites.

Small fat containing bilateral inguinal hernias, right greater than
left.

Small fat containing periumbilical hernia (series 2/image 55).

Musculoskeletal: Degenerative changes the lower thoracic spine.
IMPRESSION: No evidence of bowel obstruction.  Normal appendix.

Small periumbilical and bilateral inguinal hernias.

No CT findings to account for the patient's right abdominal pain.

## 2017-11-22 DIAGNOSIS — L718 Other rosacea: Secondary | ICD-10-CM | POA: Diagnosis not present

## 2017-12-14 ENCOUNTER — Other Ambulatory Visit: Payer: Self-pay | Admitting: Internal Medicine

## 2018-01-04 ENCOUNTER — Encounter: Payer: BLUE CROSS/BLUE SHIELD | Admitting: Internal Medicine

## 2018-01-05 ENCOUNTER — Encounter: Payer: Self-pay | Admitting: Internal Medicine

## 2018-01-05 ENCOUNTER — Ambulatory Visit (INDEPENDENT_AMBULATORY_CARE_PROVIDER_SITE_OTHER): Payer: BLUE CROSS/BLUE SHIELD | Admitting: Internal Medicine

## 2018-01-05 VITALS — BP 128/80 | HR 64 | Temp 97.9°F | Resp 14 | Ht 69.0 in | Wt 241.5 lb

## 2018-01-05 DIAGNOSIS — R05 Cough: Secondary | ICD-10-CM | POA: Diagnosis not present

## 2018-01-05 DIAGNOSIS — I10 Essential (primary) hypertension: Secondary | ICD-10-CM

## 2018-01-05 DIAGNOSIS — Z0000 Encounter for general adult medical examination without abnormal findings: Secondary | ICD-10-CM

## 2018-01-05 DIAGNOSIS — E785 Hyperlipidemia, unspecified: Secondary | ICD-10-CM | POA: Diagnosis not present

## 2018-01-05 DIAGNOSIS — Z0001 Encounter for general adult medical examination with abnormal findings: Secondary | ICD-10-CM

## 2018-01-05 DIAGNOSIS — D696 Thrombocytopenia, unspecified: Secondary | ICD-10-CM

## 2018-01-05 LAB — COMPREHENSIVE METABOLIC PANEL
ALT: 53 U/L (ref 0–53)
AST: 33 U/L (ref 0–37)
Albumin: 4 g/dL (ref 3.5–5.2)
Alkaline Phosphatase: 63 U/L (ref 39–117)
BUN: 11 mg/dL (ref 6–23)
CHLORIDE: 100 meq/L (ref 96–112)
CO2: 33 meq/L — AB (ref 19–32)
CREATININE: 1.01 mg/dL (ref 0.40–1.50)
Calcium: 9.3 mg/dL (ref 8.4–10.5)
GFR: 80.59 mL/min (ref >60.00)
Glucose, Bld: 126 mg/dL — ABNORMAL HIGH (ref 70–99)
Potassium: 3.7 mEq/L (ref 3.5–5.1)
SODIUM: 141 meq/L (ref 135–145)
Total Bilirubin: 0.7 mg/dL (ref 0.2–1.2)
Total Protein: 6.7 g/dL (ref 6.0–8.3)

## 2018-01-05 LAB — CBC WITH DIFFERENTIAL/PLATELET
BASOS PCT: 0.8 % (ref 0.0–3.0)
Basophils Absolute: 0 10*3/uL (ref 0.0–0.1)
EOS ABS: 0.1 10*3/uL (ref 0.0–0.7)
Eosinophils Relative: 1.2 % (ref 0.0–5.0)
HCT: 42.1 % (ref 39.0–52.0)
Hemoglobin: 15.1 g/dL (ref 13.0–17.0)
LYMPHS ABS: 1.5 10*3/uL (ref 0.7–4.0)
Lymphocytes Relative: 28.4 % (ref 12.0–46.0)
MCHC: 35.8 g/dL (ref 30.0–36.0)
MCV: 97.6 fl (ref 78.0–100.0)
MONO ABS: 0.7 10*3/uL (ref 0.1–1.0)
Monocytes Relative: 12.5 % — ABNORMAL HIGH (ref 3.0–12.0)
NEUTROS ABS: 3 10*3/uL (ref 1.4–7.7)
NEUTROS PCT: 57.1 % (ref 43.0–77.0)
RBC: 4.31 Mil/uL (ref 4.22–5.81)
RDW: 13.4 % (ref 11.5–15.5)
WBC: 5.2 10*3/uL (ref 4.0–10.5)

## 2018-01-05 LAB — PSA: PSA: 0.64 ng/mL (ref 0.10–4.00)

## 2018-01-05 NOTE — Progress Notes (Signed)
Subjective:    Patient ID: George Hernandez, male    DOB: May 21, 1961, 57 y.o.   MRN: [id number]  DOS:  01/05/2018 Type of visit - description : cpx Interval history: Doing well, no major concerns.   Review of Systems GI symptoms at baseline. Had a URI in December and then again  2 weeks ago, currently his only sx is a lingering cough.  The morning he saw some sputum, otherwise cough is dry.  Mild postnasal dripping?.  Other than above, a 14 point review of systems is negative    Past Medical History:  Diagnosis Date  . CAD (coronary artery disease)    Mild non-obstructive CAD per catrh  . Fatty liver    disease, dx in [place] early 2000  . GERD (gastroesophageal reflux disease)    Hiatal hernia  . Hyperlipemia   . Hypertension   . IBS (irritable bowel syndrome)    chronic upper abd pain, Dx in [place] in early 2000 (EGD and sigmoidoscopy)  . Obstructive sleep apnea   . Skin cancer    sees derm q 6 months   . Sleep apnea   . Tubular adenoma 2011   repeat cscope in 5 years, Dr. Rolm Bookbinder    Past Surgical History:  Procedure Laterality Date  . ESOPHAGOGASTRODUODENOSCOPY  2011   Dr. Rolm Bookbinder  . Right Groin SCC removed  2008   sees derm routinely per pt     Social History   Socioeconomic History  . Marital status: Married    Spouse name: Not on file  . Number of children: 1   . Years of education: Not on file  . Highest education level: Not on file  Social Needs  . Financial resource strain: Not on file  . Food insecurity - worry: Not on file  . Food insecurity - inability: Not on file  . Transportation needs - medical: Not on file  . Transportation needs - non-medical: Not on file  Occupational History  . Occupation: IT, Careers information officer  Tobacco Use  . Smoking status: Former Research scientist (life sciences)  . Smokeless tobacco: Never Used  . Tobacco comment: quit 2008  Substance and Sexual Activity  . Alcohol use: Yes    Alcohol/week: 3.6 - 4.8 oz    Types: 6 - 8 Cans  of beer per week  . Drug use: No  . Sexual activity: Not on file  Other Topics Concern  . Not on file  Social History Narrative   Household-- pt, wife    Daughter 1999 in college [place]     Family History  Problem Relation Age of Onset  . Diabetes Mother   . Hypertension Mother   . Stroke Father 94  . CAD Other        GF  . Colon cancer Neg Hx   . Prostate cancer Neg Hx      Allergies as of 01/05/2018      Reactions   Bee Venom Shortness Of Breath   Childhood reaction      Medication List        Accurate as of 01/05/18 11:59 PM. Always use your most recent med list.          aspirin 81 MG tablet Take 81 mg by mouth daily.   atorvastatin 10 MG tablet Commonly known as:  LIPITOR Take 0.5 tablets (5 mg total) by mouth at bedtime.   carvedilol 12.5 MG tablet Commonly known as:  COREG Take 1 tablet (12.5 mg total) 2 (  two) times daily with a meal by mouth.   dicyclomine 20 MG tablet Commonly known as:  BENTYL Take 1 tablet (20 mg total) by mouth every 6 (six) hours as needed for spasms.   FIBER PO Take 1 tablet by mouth daily.   losartan-hydrochlorothiazide 100-25 MG tablet Commonly known as:  HYZAAR Take 1 tablet by mouth daily.   multivitamin Tabs tablet Take 1 tablet by mouth daily.   omeprazole 20 MG tablet Commonly known as:  PRILOSEC OTC Take 20 mg by mouth daily. Reported on 11/20/2015          Objective:   Physical Exam BP 128/80 (BP Location: Left Arm, Patient Position: Sitting, Cuff Size: Normal)   Pulse 64   Temp 97.9 F (36.6 C) (Oral)   Resp 14   Ht 5\' 9"  (1.753 m)   Wt 241 lb 8 oz (109.5 kg)   SpO2 98%   BMI 35.66 kg/m  General:   Well developed, well nourished . NAD.  Neck: No  thyromegaly  HEENT:  Normocephalic . Face symmetric, atraumatic.  Nose is slightly congested, throat symmetric, TMs wnl Lungs:  CTA B Normal respiratory effort, no intercostal retractions, no accessory muscle use. Heart: RRR,  no murmur.  No  pretibial edema bilaterally  Abdomen:  Not distended, soft, minimal tenderness throughout no rebound or rigidity ( baseline) Rectal: External abnormalities: none. Normal sphincter tone. No rectal masses or tenderness.  Brown stools Prostate: Prostate gland firm and smooth, no enlargement, nodularity, tenderness, mass, asymmetry or induration Skin: Exposed areas without rash. Not pale. Not jaundice Neurologic:  alert & oriented X3.  Speech normal, gait appropriate for age and unassisted Strength symmetric and appropriate for age.  Psych: Cognition and judgment appear intact.  Cooperative with normal attention span and concentration.  Behavior appropriate. No anxious or depressed appearing.     Assessment & Plan:   Assessment Prediabetes HTN Hyperlipidemia Anxiety- xanax, low risk UDS 11-2015; self DC Xanax 06-2016,  Nonobstructive CAD by cath Morbid  obesity (BMI 37  and pre-DM, HTN, hyperlipidemia) GI:  --GERD, HH --IBS: Chronic upper abdominal pain, DX in [place]~2,000, had a EGD/sigmoidoscopy --Fatty liver DX 2000 --Cscope 12-16-2009--  One 52mm polyp, tbular adenoma, Dr Rolm Bookbinder --Cscope again 01-20-16 @ [place] --EGD 2011 at [facility], also a EUS EGD @ [facility], had a bx---benign findings , no barrett's --abd pain, ER 10-2016 (-) CT OSA, mild per sleep study 2012 - no CPAP SCC, right groin, removed 2008, sees dermatology Paresthesias, saw neurology 2013: Neuropathy? Meralgia paresthetica? Radiculopathy? Rx observation  PLAN DM: Last A1c satisfactory HTN, continue carvedilol, Hyzaar, checking labs. Hyperlipidemia: On Lipitor, last LDL satisfactory, no change GI symptoms: Unchanged, at baseline Cough: Persistent after a URI, exam is essentially negative, recommend Flonase and Mucinex, call if not better in 2-3 weeks RTC 1 year

## 2018-01-05 NOTE — Progress Notes (Signed)
Pre visit review using our clinic review tool, if applicable. No additional management support is needed unless otherwise documented below in the visit note. 

## 2018-01-05 NOTE — Patient Instructions (Signed)
GO TO THE LAB : Get the blood work     GO TO THE FRONT DESK Schedule your next appointment for a  Physical exam in 1 year, fasting   Check the  blood pressure monthly   Be sure your blood pressure is between 110/65 and  135/85. If it is consistently higher or lower, let me know  For cough: Flonase 2 sprays on each side of the nose daily Mucinex DM OTC twice a day as needed Call if not improving

## 2018-01-05 NOTE — Assessment & Plan Note (Addendum)
-  Td 2008- declined booster today; doesn't take flu shots - CCS:  Cscope 2011-- had bx, told they were ok,  Dr Rolm Bookbinder Cscope again 01-20-16 @ [place] , told ok   - prostate ca screening: DRE today neg, check a PSA  - Diet and exercise discussed   -Labs reviewed, last FLP very good, check a CMP, CBC, PSA

## 2018-01-06 NOTE — Assessment & Plan Note (Signed)
DM: Last A1c satisfactory HTN, continue carvedilol, Hyzaar, checking labs. Hyperlipidemia: On Lipitor, last LDL satisfactory, no change GI symptoms: Unchanged, at baseline Cough: Persistent after a URI, exam is essentially negative, recommend Flonase and Mucinex, call if not better in 2-3 weeks RTC 1 year

## 2018-01-09 NOTE — Addendum Note (Signed)
Addended byDamita Dunnings D on: 01/09/2018 01:13 PM   Modules accepted: Orders

## 2018-01-31 ENCOUNTER — Other Ambulatory Visit: Payer: Self-pay | Admitting: Internal Medicine

## 2018-02-02 ENCOUNTER — Other Ambulatory Visit (INDEPENDENT_AMBULATORY_CARE_PROVIDER_SITE_OTHER): Payer: BLUE CROSS/BLUE SHIELD

## 2018-02-02 DIAGNOSIS — D696 Thrombocytopenia, unspecified: Secondary | ICD-10-CM | POA: Diagnosis not present

## 2018-02-02 LAB — CBC WITH DIFFERENTIAL/PLATELET
BASOS ABS: 0 10*3/uL (ref 0.0–0.1)
Basophils Relative: 0.8 % (ref 0.0–3.0)
Eosinophils Absolute: 0.1 10*3/uL (ref 0.0–0.7)
Eosinophils Relative: 1 % (ref 0.0–5.0)
HCT: 42.9 % (ref 39.0–52.0)
Hemoglobin: 15.2 g/dL (ref 13.0–17.0)
LYMPHS ABS: 1.8 10*3/uL (ref 0.7–4.0)
Lymphocytes Relative: 29 % (ref 12.0–46.0)
MCHC: 35.5 g/dL (ref 30.0–36.0)
MCV: 98 fl (ref 78.0–100.0)
MONOS PCT: 12.5 % — AB (ref 3.0–12.0)
Monocytes Absolute: 0.8 10*3/uL (ref 0.1–1.0)
NEUTROS PCT: 56.7 % (ref 43.0–77.0)
Neutro Abs: 3.4 10*3/uL (ref 1.4–7.7)
Platelets: 122 10*3/uL — ABNORMAL LOW (ref 150.0–400.0)
RBC: 4.38 Mil/uL (ref 4.22–5.81)
RDW: 13.2 % (ref 11.5–15.5)
WBC: 6.1 10*3/uL (ref 4.0–10.5)

## 2018-02-27 ENCOUNTER — Ambulatory Visit (INDEPENDENT_AMBULATORY_CARE_PROVIDER_SITE_OTHER): Payer: BLUE CROSS/BLUE SHIELD | Admitting: Internal Medicine

## 2018-02-27 ENCOUNTER — Encounter: Payer: Self-pay | Admitting: Internal Medicine

## 2018-02-27 VITALS — BP 128/80 | HR 81 | Temp 98.0°F | Resp 14 | Ht 69.0 in | Wt 241.2 lb

## 2018-02-27 DIAGNOSIS — D696 Thrombocytopenia, unspecified: Secondary | ICD-10-CM

## 2018-02-27 LAB — CBC WITH DIFFERENTIAL/PLATELET
BASOS PCT: 0.9 % (ref 0.0–3.0)
Basophils Absolute: 0 10*3/uL (ref 0.0–0.1)
Eosinophils Absolute: 0.1 10*3/uL (ref 0.0–0.7)
Eosinophils Relative: 1.2 % (ref 0.0–5.0)
HEMATOCRIT: 40 % (ref 39.0–52.0)
HEMOGLOBIN: 14.4 g/dL (ref 13.0–17.0)
LYMPHS PCT: 26.6 % (ref 12.0–46.0)
Lymphs Abs: 1.3 10*3/uL (ref 0.7–4.0)
MCHC: 36 g/dL (ref 30.0–36.0)
MCV: 97.4 fl (ref 78.0–100.0)
MONO ABS: 0.6 10*3/uL (ref 0.1–1.0)
Monocytes Relative: 11.3 % (ref 3.0–12.0)
Neutro Abs: 3 10*3/uL (ref 1.4–7.7)
Neutrophils Relative %: 60 % (ref 43.0–77.0)
Platelets: 163 10*3/uL (ref 150.0–400.0)
RBC: 4.11 Mil/uL — ABNORMAL LOW (ref 4.22–5.81)
RDW: 12.7 % (ref 11.5–15.5)
WBC: 4.9 10*3/uL (ref 4.0–10.5)

## 2018-02-27 NOTE — Assessment & Plan Note (Signed)
Thrombocytopenia: Transient thrombocytopenia without other cellular lines affected, no symptoms.  Etiology unclear however there is no red flags. CT abdomen 2017: normal size liver and spleen US RUQ  abdomen 01-2017: Fatty liver Plan: Check a CBC, HIV, hep C and peripheral blood smear. If platelet count back to normal recommend observation otherwise refer to hematology.

## 2018-02-27 NOTE — Patient Instructions (Signed)
GO TO THE LAB : Get the blood work     Please let us know if you have questions, I can arrange a visit with specialist  Call anytime if you have a rash, uncommon or severe bleeding, fever or chills.

## 2018-02-27 NOTE — Progress Notes (Signed)
Pre visit review using our clinic review tool, if applicable. No additional management support is needed unless otherwise documented below in the visit note. 

## 2018-02-27 NOTE — Progress Notes (Signed)
Subjective:    Patient ID: George Hernandez, male    DOB: 10-01-1961, 57 y.o.   MRN: [id number]  DOS:  02/27/2018 Type of visit - description : acute Interval history: Concerned about thrombocytopenia. We reviewed the labs together, the only problem we found is the platelet count, all other cell lines are normal. He feels well.   Review of Systems  Denies fever, chills. No rash No blood in the urine Occasional blood in the stools associated with rectal itching, felt to be due to hemorrhoids occ gum bleeding when he teeth  brushing.  Has seen his dentist before, was  recommended more flossing.  Past Medical History:  Diagnosis Date  . CAD (coronary artery disease)    Mild non-obstructive CAD per catrh  . Fatty liver    disease, dx in [place] early 2000  . GERD (gastroesophageal reflux disease)    Hiatal hernia  . Hyperlipemia   . Hypertension   . IBS (irritable bowel syndrome)    chronic upper abd pain, Dx in [place] in early 2000 (EGD and sigmoidoscopy)  . Obstructive sleep apnea   . Skin cancer    sees derm q 6 months   . Sleep apnea   . Tubular adenoma 2011   repeat cscope in 5 years, Dr. Rolm Bookbinder    Past Surgical History:  Procedure Laterality Date  . ESOPHAGOGASTRODUODENOSCOPY  2011   Dr. Rolm Bookbinder  . Right Groin SCC removed  2008   sees derm routinely per pt     Social History   Socioeconomic History  . Marital status: Married    Spouse name: Not on file  . Number of children: 1   . Years of education: Not on file  . Highest education level: Not on file  Occupational History  . Occupation: IT, Careers information officer  Social Needs  . Financial resource strain: Not on file  . Food insecurity:    Worry: Not on file    Inability: Not on file  . Transportation needs:    Medical: Not on file    Non-medical: Not on file  Tobacco Use  . Smoking status: Former Research scientist (life sciences)  . Smokeless tobacco: Never Used  . Tobacco comment: quit 2008  Substance and  Sexual Activity  . Alcohol use: Yes    Alcohol/week: 3.6 - 4.8 oz    Types: 6 - 8 Cans of beer per week  . Drug use: No  . Sexual activity: Not on file  Lifestyle  . Physical activity:    Days per week: Not on file    Minutes per session: Not on file  . Stress: Not on file  Relationships  . Social connections:    Talks on phone: Not on file    Gets together: Not on file    Attends religious service: Not on file    Active member of club or organization: Not on file    Attends meetings of clubs or organizations: Not on file    Relationship status: Not on file  . Intimate partner violence:    Fear of current or ex partner: Not on file    Emotionally abused: Not on file    Physically abused: Not on file    Forced sexual activity: Not on file  Other Topics Concern  . Not on file  Social History Narrative   Household-- pt, wife    Daughter 1999 in [place] as of 02/27/2018  Reactions   Bee Venom Shortness Of Breath   Childhood reaction      Medication List        Accurate as of 02/27/18  8:24 AM. Always use your most recent med list.          aspirin 81 MG tablet Take 81 mg by mouth daily.   atorvastatin 10 MG tablet Commonly known as:  LIPITOR Take 0.5 tablets (5 mg total) by mouth at bedtime.   carvedilol 12.5 MG tablet Commonly known as:  COREG Take 1 tablet (12.5 mg total) by mouth 2 (two) times daily with a meal.   dicyclomine 20 MG tablet Commonly known as:  BENTYL Take 1 tablet (20 mg total) by mouth every 6 (six) hours as needed for spasms.   FIBER PO Take 1 tablet by mouth daily.   losartan-hydrochlorothiazide 100-25 MG tablet Commonly known as:  HYZAAR Take 1 tablet by mouth daily.   multivitamin Tabs tablet Take 1 tablet by mouth daily.   omeprazole 20 MG tablet Commonly known as:  PRILOSEC OTC Take 20 mg by mouth daily. Reported on 11/20/2015          Objective:   Physical Exam BP 128/80 (BP Location: Left  Arm, Patient Position: Sitting, Cuff Size: Normal)   Pulse 81   Temp 98 F (36.7 C) (Oral)   Resp 14   Ht 5\' 9"  (1.753 m)   Wt 241 lb 4 oz (109.4 kg)   SpO2 98%   BMI 35.63 kg/m  General:   Well developed, well nourished . NAD.  HEENT:  Normocephalic . Face symmetric, atraumatic Abdomen:  Not distended, soft, non-tender. No rebound or rigidity.  No organomegaly Skin: Not pale. Not jaundice Neurologic:  alert & oriented X3.  Speech normal, gait appropriate for age and unassisted Psych--  Cognition and judgment appear intact.  Cooperative with normal attention span and concentration.  Behavior appropriate. No anxious or depressed appearing.     Assessment & Plan:    Assessment Prediabetes HTN Hyperlipidemia Anxiety- xanax, low risk UDS 11-2015; self DC Xanax 06-2016,  Nonobstructive CAD by cath Morbid  obesity (BMI 37  and pre-DM, HTN, hyperlipidemia) GI:  --GERD, HH --IBS: Chronic upper abdominal pain, DX in [place]~2,000, had a EGD/sigmoidoscopy --Fatty liver DX 2000 --Cscope 12-16-2009--  One 65mm polyp, tbular adenoma, Dr Rolm Bookbinder --Cscope again 01-20-16 @ [place] --EGD 2011 at [facility], also a EUS EGD @ [facility], had a bx---benign findings , no barrett's --abd pain, ER 10-2016 (-) CT OSA, mild per sleep study 2012 - no CPAP SCC, right groin, removed 2008, sees dermatology Paresthesias, saw neurology 2013: Neuropathy? Meralgia paresthetica? Radiculopathy? Rx observation  PLAN Thrombocytopenia: Transient thrombocytopenia without other cellular lines affected, no symptoms.  Etiology unclear however there is no red flags. CT abdomen 2017: normal size liver and spleen US RUQ  abdomen 01-2017: Fatty liver Plan: Check a CBC, HIV, hep C and peripheral blood smear. If platelet count back to normal recommend observation otherwise refer to hematology.

## 2018-03-01 LAB — EXTRA LAV TOP TUBE

## 2018-03-01 LAB — HIV ANTIBODY (ROUTINE TESTING W REFLEX): HIV 1&2 Ab, 4th Generation: NONREACTIVE

## 2018-03-01 LAB — HEPATITIS C ANTIBODY
Hepatitis C Ab: NONREACTIVE
SIGNAL TO CUT-OFF: 0.02 (ref <1.00)

## 2018-03-22 DIAGNOSIS — D696 Thrombocytopenia, unspecified: Secondary | ICD-10-CM

## 2018-03-27 ENCOUNTER — Other Ambulatory Visit (INDEPENDENT_AMBULATORY_CARE_PROVIDER_SITE_OTHER): Payer: BLUE CROSS/BLUE SHIELD

## 2018-03-27 DIAGNOSIS — D696 Thrombocytopenia, unspecified: Secondary | ICD-10-CM | POA: Diagnosis not present

## 2018-03-27 NOTE — Addendum Note (Signed)
Addended by: Harl Bowie on: 03/27/2018 02:48 PM   Modules accepted: Orders

## 2018-03-27 NOTE — Addendum Note (Signed)
Addended by: Harl Bowie on: 03/27/2018 08:18 AM   Modules accepted: Orders

## 2018-03-28 LAB — CBC (INCLUDES DIFF/PLT) WITH PATHOLOGIST REVIEW
BASOS ABS: 51 {cells}/uL (ref 0–200)
Basophils Relative: 1.1 %
EOS ABS: 60 {cells}/uL (ref 15–500)
Eosinophils Relative: 1.3 %
HCT: 41.4 % (ref 38.5–50.0)
HEMOGLOBIN: 14.5 g/dL (ref 13.2–17.1)
LYMPHS ABS: 1633 {cells}/uL (ref 850–3900)
MCH: 34.3 pg — ABNORMAL HIGH (ref 27.0–33.0)
MCHC: 35 g/dL (ref 32.0–36.0)
MCV: 97.9 fL (ref 80.0–100.0)
MPV: 9.2 fL (ref 7.5–12.5)
Monocytes Relative: 13.2 %
Neutro Abs: 2249 cells/uL (ref 1500–7800)
Neutrophils Relative %: 48.9 %
PLATELETS: 147 10*3/uL (ref 140–400)
RBC: 4.23 10*6/uL (ref 4.20–5.80)
RDW: 13 % (ref 11.0–15.0)
Total Lymphocyte: 35.5 %
WBC: 4.6 10*3/uL (ref 3.8–10.8)
WBCMIX: 607 {cells}/uL (ref 200–950)

## 2018-04-08 ENCOUNTER — Other Ambulatory Visit: Payer: Self-pay | Admitting: Internal Medicine

## 2018-06-16 ENCOUNTER — Other Ambulatory Visit: Payer: Self-pay | Admitting: Internal Medicine

## 2018-09-25 ENCOUNTER — Other Ambulatory Visit: Payer: Self-pay

## 2018-09-25 MED ORDER — HYDROCHLOROTHIAZIDE 25 MG PO TABS: 25.0000 mg | ORAL_TABLET | Freq: Every day | ORAL | 2 refills | Status: AC

## 2018-09-25 MED ORDER — LOSARTAN POTASSIUM 100 MG PO TABS: 100.0000 mg | ORAL_TABLET | Freq: Every day | ORAL | 2 refills | Status: AC

## 2018-10-19 DIAGNOSIS — M7531 Calcific tendinitis of right shoulder: Secondary | ICD-10-CM | POA: Diagnosis not present

## 2018-10-19 DIAGNOSIS — M25511 Pain in right shoulder: Secondary | ICD-10-CM | POA: Diagnosis not present

## 2018-10-19 DIAGNOSIS — M5412 Radiculopathy, cervical region: Secondary | ICD-10-CM | POA: Diagnosis not present

## 2018-10-19 DIAGNOSIS — G8929 Other chronic pain: Secondary | ICD-10-CM | POA: Diagnosis not present

## 2018-10-24 ENCOUNTER — Other Ambulatory Visit: Payer: Self-pay | Admitting: Orthopedic Surgery

## 2018-10-24 DIAGNOSIS — R531 Weakness: Secondary | ICD-10-CM

## 2018-10-24 DIAGNOSIS — R52 Pain, unspecified: Secondary | ICD-10-CM

## 2018-11-17 ENCOUNTER — Ambulatory Visit
Admission: RE | Admit: 2018-11-17 | Discharge: 2018-11-17 | Disposition: A | Discharge status: Home / Self Care | Payer: BLUE CROSS/BLUE SHIELD | Source: Ambulatory Visit | Attending: Orthopedic Surgery | Admitting: Orthopedic Surgery

## 2018-11-17 DIAGNOSIS — R531 Weakness: Secondary | ICD-10-CM

## 2018-11-17 DIAGNOSIS — R52 Pain, unspecified: Secondary | ICD-10-CM

## 2018-11-17 DIAGNOSIS — M4802 Spinal stenosis, cervical region: Secondary | ICD-10-CM | POA: Diagnosis not present

## 2018-11-23 DIAGNOSIS — M50221 Other cervical disc displacement at C4-C5 level: Secondary | ICD-10-CM | POA: Diagnosis not present

## 2018-11-23 DIAGNOSIS — M25511 Pain in right shoulder: Secondary | ICD-10-CM | POA: Diagnosis not present

## 2018-11-23 DIAGNOSIS — M50123 Cervical disc disorder at C6-C7 level with radiculopathy: Secondary | ICD-10-CM | POA: Diagnosis not present

## 2018-11-23 DIAGNOSIS — G8929 Other chronic pain: Secondary | ICD-10-CM | POA: Diagnosis not present

## 2018-12-04 ENCOUNTER — Encounter: Payer: Self-pay | Admitting: Internal Medicine

## 2018-12-04 ENCOUNTER — Ambulatory Visit (INDEPENDENT_AMBULATORY_CARE_PROVIDER_SITE_OTHER): Payer: BLUE CROSS/BLUE SHIELD | Admitting: Internal Medicine

## 2018-12-04 VITALS — BP 122/80 | HR 78 | Temp 98.5°F | Resp 16 | Ht 69.0 in | Wt 249.4 lb

## 2018-12-04 DIAGNOSIS — J392 Other diseases of pharynx: Secondary | ICD-10-CM

## 2018-12-04 NOTE — Patient Instructions (Signed)
We are referring you to the ENT doctor  If you do not hear from them within the next week please let me know.

## 2018-12-04 NOTE — Progress Notes (Signed)
Subjective:    Patient ID: George Hernandez, male    DOB: 04/21/1961, 58 y.o.   MRN: [id number]  DOS:  12/04/2018 Type of visit - description: Acute Having "something" in the throat for the last 4 to 5 weeks. It causes discomfort for occasionally triggers a gag reflex  Review of Systems  Denies fever chills No chronic allergies No recent URI he is former smoker, quit in 2010  Past Medical History:  Diagnosis Date  . CAD (coronary artery disease)    Mild non-obstructive CAD per catrh  . Fatty liver    disease, dx in [place] early 2000  . GERD (gastroesophageal reflux disease)    Hiatal hernia  . Hyperlipemia   . Hypertension   . IBS (irritable bowel syndrome)    chronic upper abd pain, Dx in [place] in early 2000 (EGD and sigmoidoscopy)  . Obstructive sleep apnea   . Skin cancer    sees derm q 6 months   . Sleep apnea   . Tubular adenoma 2011   repeat cscope in 5 years, Dr. Rolm Bookbinder    Past Surgical History:  Procedure Laterality Date  . ESOPHAGOGASTRODUODENOSCOPY  2011   Dr. Rolm Bookbinder  . Right Groin SCC removed  2008   sees derm routinely per pt     Social History   Socioeconomic History  . Marital status: Married    Spouse name: Not on file  . Number of children: 1   . Years of education: Not on file  . Highest education level: Not on file  Occupational History  . Occupation: IT, Careers information officer  Social Needs  . Financial resource strain: Not on file  . Food insecurity:    Worry: Not on file    Inability: Not on file  . Transportation needs:    Medical: Not on file    Non-medical: Not on file  Tobacco Use  . Smoking status: Former Research scientist (life sciences)  . Smokeless tobacco: Never Used  . Tobacco comment: quit 2008  Substance and Sexual Activity  . Alcohol use: Yes    Alcohol/week: 6.0 - 8.0 standard drinks    Types: 6 - 8 Cans of beer per week  . Drug use: No  . Sexual activity: Not on file  Lifestyle  . Physical activity:    Days per week: Not on  file    Minutes per session: Not on file  . Stress: Not on file  Relationships  . Social connections:    Talks on phone: Not on file    Gets together: Not on file    Attends religious service: Not on file    Active member of club or organization: Not on file    Attends meetings of clubs or organizations: Not on file    Relationship status: Not on file  . Intimate partner violence:    Fear of current or ex partner: Not on file    Emotionally abused: Not on file    Physically abused: Not on file    Forced sexual activity: Not on file  Other Topics Concern  . Not on file  Social History Narrative   Household-- pt, wife    Daughter 1999 in [place]      Allergies as of 12/04/2018      Reactions   Bee Venom Shortness Of Breath   Childhood reaction      Medication List       Accurate as of December 04, 2018 11:59 PM. Always use your  most recent med list.        aspirin 81 MG tablet Take 81 mg by mouth daily.   atorvastatin 10 MG tablet Commonly known as:  LIPITOR Take 0.5 tablets (5 mg total) by mouth at bedtime.   carvedilol 12.5 MG tablet Commonly known as:  COREG Take 1 tablet (12.5 mg total) by mouth 2 (two) times daily with a meal.   dicyclomine 20 MG tablet Commonly known as:  BENTYL Take 1 tablet (20 mg total) by mouth every 6 (six) hours as needed for spasms.   FIBER PO Take 1 tablet by mouth daily.   hydrochlorothiazide 25 MG tablet Commonly known as:  HYDRODIURIL Take 1 tablet (25 mg total) by mouth daily.   losartan 100 MG tablet Commonly known as:  COZAAR Take 1 tablet (100 mg total) by mouth daily.   multivitamin Tabs tablet Take 1 tablet by mouth daily.   omeprazole 20 MG tablet Commonly known as:  PRILOSEC OTC Take 20 mg by mouth daily. Reported on 11/20/2015           Objective:   Physical Exam HENT:     Mouth/Throat:     BP 122/80 (BP Location: Left Arm, Patient Position: Sitting, Cuff Size: Normal)   Pulse 78   Temp  98.5 F (36.9 C) (Oral)   Resp 16   Ht 5\' 9"  (1.753 m)   Wt 249 lb 6 oz (113.1 kg)   SpO2 96%   BMI 36.83 kg/m  General:   Well developed, NAD, BMI noted. HEENT:  Normocephalic . Face symmetric, atraumatic Nose: Inspection of the nose showed normal membranes and no obvious lesions TMs normal. Throat: Uvula midline, no redness or discharge, he does have a papilloma-like lesion.  See graphic Neck: No lymphadenopathies Skin: Not pale. Not jaundice Neurologic:  alert & oriented X3.  Speech normal, gait appropriate for age and unassisted Psych--  Cognition and judgment appear intact.  Cooperative with normal attention span and concentration.  Behavior appropriate. No anxious or depressed appearing.      Assessment      Assessment Prediabetes HTN Hyperlipidemia Anxiety- xanax, low risk UDS 11-2015; self DC Xanax 06-2016,  Nonobstructive CAD by cath Morbid  obesity (BMI 37  and pre-DM, HTN, hyperlipidemia) GI:  --GERD, HH --IBS: Chronic upper abdominal pain, DX in [place]~2,000, had a EGD/sigmoidoscopy --Fatty liver DX 2000 --Cscope 12-16-2009--  One 34mm polyp, tbular adenoma, Dr Rolm Bookbinder --Cscope again 01-20-16 @ [place] --EGD 2011 at [facility], also a EUS EGD @ [facility], had a bx---benign findings , no barrett's --abd pain, ER 10-2016 (-) CT OSA, mild per sleep study 2012 - no CPAP SCC, right groin, removed 2008, sees dermatology Paresthesias, saw neurology 2013: Neuropathy? Meralgia paresthetica? Radiculopathy? Rx observation  PLAN Throat lesion: Suspect papilloma, needs a ENT referral, advised patient is important to keep the follow-up ; definitely needs to be looked at by the specialist. He verbalized understanding.

## 2018-12-04 NOTE — Progress Notes (Signed)
Pre visit review using our clinic review tool, if applicable. No additional management support is needed unless otherwise documented below in the visit note. 

## 2018-12-05 ENCOUNTER — Telehealth: Payer: Self-pay

## 2018-12-05 NOTE — Telephone Encounter (Signed)
Referral cancelled. 

## 2018-12-05 NOTE — Assessment & Plan Note (Signed)
Throat lesion: Suspect papilloma, needs a ENT referral, advised patient is important to keep the follow-up ; definitely needs to be looked at by the specialist. He verbalized understanding.

## 2018-12-05 NOTE — Telephone Encounter (Signed)
Okay to cancel referral. 

## 2018-12-05 NOTE — Telephone Encounter (Signed)
Copied from [place] [id number]. Topic: General - Other >> Dec 05, 2018  8:23 AM Keene Breath wrote: Reason for CRM: Patient called to inform Dr. Larose Kells that he no longer needs the referral for an ENT doctor.  Patient stated that he is going to see a doctor that was recommend to him.  Please call if you have any questions.  CB# [phone]

## 2018-12-05 NOTE — Telephone Encounter (Signed)
Patient is well aware of how important is to see ENT.  Okay to cancel

## 2018-12-06 DIAGNOSIS — Z87891 Personal history of nicotine dependence: Secondary | ICD-10-CM | POA: Diagnosis not present

## 2018-12-06 DIAGNOSIS — C051 Malignant neoplasm of soft palate: Secondary | ICD-10-CM | POA: Diagnosis not present

## 2018-12-06 DIAGNOSIS — D0004 Carcinoma in situ of soft palate: Secondary | ICD-10-CM | POA: Diagnosis not present

## 2018-12-06 DIAGNOSIS — C059 Malignant neoplasm of palate, unspecified: Secondary | ICD-10-CM | POA: Diagnosis not present

## 2018-12-14 DIAGNOSIS — C051 Malignant neoplasm of soft palate: Secondary | ICD-10-CM | POA: Diagnosis not present

## 2018-12-25 ENCOUNTER — Telehealth: Payer: Self-pay | Admitting: Internal Medicine

## 2018-12-25 NOTE — Telephone Encounter (Signed)
Naki called from the [facility] Department regarding the death certificate for this patient George Hernandez). Loyal Jacobson stated she needed more information before the certificate was complete. Call routed to [facility] at [facility] and conference with Dr. Larose Kells.

## 2018-12-25 NOTE — Telephone Encounter (Signed)
I missed the "manner of death", of all the options to check, the best seem "natural"

## 2018-12-25 NOTE — Telephone Encounter (Signed)
I received the death certificate, spoke with the wife, she reports that George Hernandez had a biopsy of a soft palate lesion I found, eventually had surgery on 12/19/2018. He was discharged home, went to sleep on 12/22/2018 and shortly after woke up  vomiting blood. EMS was called, the medical examiner decided not to pursue the case, the wife tells me she requested a private autopsy,  and now he will be cremated. I reach out to Dr. Laurance Flatten, the surgeon, he confirmed that the patient had surgery and he suspect he had a post surgical  tonsillectomy bleeding. Based on this information, I will sign the death certificate.

## 2018-12-25 NOTE — Telephone Encounter (Signed)
Death Certificate dropped off for provider to fill out ( Document given to providers CMA) When done to fax document to [phone] and to call at [phone] to be pick up. [facility] [employer])

## 2018-12-25 NOTE — Telephone Encounter (Signed)
Form faxed to Richrd Humbles at [phone]. Informed Richrd Humbles that death certificate is ready for pick up. Copy of certificate sent for scanning.

## 2018-12-25 NOTE — Telephone Encounter (Signed)
Death certificate received- forwarded to PCP.

## 2019-01-07 DEATH — deceased

## 2019-01-09 ENCOUNTER — Encounter: Payer: BLUE CROSS/BLUE SHIELD | Admitting: Internal Medicine

## 2019-01-19 ENCOUNTER — Other Ambulatory Visit: Payer: Self-pay | Admitting: Internal Medicine

## 2019-01-26 ENCOUNTER — Telehealth: Payer: Self-pay

## 2019-01-26 NOTE — Telephone Encounter (Signed)
Life Insurance form received from [place] on behalf of Pt's wife George Hernandez. Form completed by PCP and faxed to [phone]. Form sent for scanning. Received fax confirmation.
